# Patient Record
Sex: Female | Born: 1975 | Race: White | Hispanic: No | Marital: Married | Smoking: Never smoker
Health system: Southern US, Community
[De-identification: ages and names within clinical notes are randomized; demographics above are authoritative.]

## PROBLEM LIST (undated history)

## (undated) DIAGNOSIS — N201 Calculus of ureter: Secondary | ICD-10-CM

## (undated) HISTORY — PX: TONSILLECTOMY AND ADENOIDECTOMY: SUR1326

## (undated) HISTORY — PX: AUGMENTATION MAMMAPLASTY: SUR837

---

## 1999-12-13 ENCOUNTER — Other Ambulatory Visit: Admission: RE | Admit: 1999-12-13 | Discharge: 1999-12-13 | Payer: Self-pay | Admitting: Obstetrics and Gynecology

## 2001-01-23 ENCOUNTER — Other Ambulatory Visit: Admission: RE | Admit: 2001-01-23 | Discharge: 2001-01-23 | Payer: Self-pay | Admitting: Obstetrics and Gynecology

## 2002-04-09 ENCOUNTER — Encounter: Payer: Self-pay | Admitting: Emergency Medicine

## 2002-04-09 ENCOUNTER — Emergency Department (HOSPITAL_COMMUNITY): Admission: EM | Admit: 2002-04-09 | Discharge: 2002-04-09 | Payer: Self-pay | Admitting: Emergency Medicine

## 2002-05-07 ENCOUNTER — Other Ambulatory Visit: Admission: RE | Admit: 2002-05-07 | Discharge: 2002-05-07 | Payer: Self-pay | Admitting: Obstetrics and Gynecology

## 2003-03-12 ENCOUNTER — Other Ambulatory Visit: Admission: RE | Admit: 2003-03-12 | Discharge: 2003-03-12 | Payer: Self-pay | Admitting: Obstetrics and Gynecology

## 2003-04-09 ENCOUNTER — Observation Stay (HOSPITAL_COMMUNITY): Admission: AD | Admit: 2003-04-09 | Discharge: 2003-04-10 | Payer: Self-pay | Admitting: Obstetrics and Gynecology

## 2003-04-09 ENCOUNTER — Encounter: Admission: RE | Admit: 2003-04-09 | Discharge: 2003-04-09 | Payer: Self-pay | Admitting: Obstetrics and Gynecology

## 2003-09-25 ENCOUNTER — Inpatient Hospital Stay (HOSPITAL_COMMUNITY): Admission: AD | Admit: 2003-09-25 | Discharge: 2003-09-27 | Payer: Self-pay | Admitting: Obstetrics and Gynecology

## 2004-02-22 HISTORY — PX: LAPAROSCOPIC CHOLECYSTECTOMY: SUR755

## 2004-06-08 ENCOUNTER — Other Ambulatory Visit: Admission: RE | Admit: 2004-06-08 | Discharge: 2004-06-08 | Payer: Self-pay | Admitting: Obstetrics and Gynecology

## 2005-05-26 ENCOUNTER — Other Ambulatory Visit: Admission: RE | Admit: 2005-05-26 | Discharge: 2005-05-26 | Payer: Self-pay | Admitting: Obstetrics and Gynecology

## 2007-03-21 ENCOUNTER — Encounter (INDEPENDENT_AMBULATORY_CARE_PROVIDER_SITE_OTHER): Payer: Self-pay | Admitting: Obstetrics and Gynecology

## 2007-03-21 ENCOUNTER — Ambulatory Visit (HOSPITAL_COMMUNITY): Admission: RE | Admit: 2007-03-21 | Discharge: 2007-03-21 | Payer: Self-pay | Admitting: Obstetrics and Gynecology

## 2007-03-21 HISTORY — PX: OTHER SURGICAL HISTORY: SHX169

## 2008-12-24 ENCOUNTER — Inpatient Hospital Stay (HOSPITAL_COMMUNITY): Admission: RE | Admit: 2008-12-24 | Discharge: 2008-12-25 | Payer: Self-pay | Admitting: Obstetrics and Gynecology

## 2010-05-26 LAB — CBC
HCT: 26.4 % — ABNORMAL LOW (ref 36.0–46.0)
HCT: 30.6 % — ABNORMAL LOW (ref 36.0–46.0)
HCT: 33 % — ABNORMAL LOW (ref 36.0–46.0)
Hemoglobin: 10.4 g/dL — ABNORMAL LOW (ref 12.0–15.0)
Hemoglobin: 11.4 g/dL — ABNORMAL LOW (ref 12.0–15.0)
Hemoglobin: 9.2 g/dL — ABNORMAL LOW (ref 12.0–15.0)
MCHC: 33.9 g/dL (ref 30.0–36.0)
MCHC: 34.4 g/dL (ref 30.0–36.0)
MCHC: 34.7 g/dL (ref 30.0–36.0)
MCV: 96.6 fL (ref 78.0–100.0)
MCV: 96.7 fL (ref 78.0–100.0)
MCV: 97.7 fL (ref 78.0–100.0)
Platelets: 113 10*3/uL — ABNORMAL LOW (ref 150–400)
Platelets: 116 10*3/uL — ABNORMAL LOW (ref 150–400)
Platelets: 97 10*3/uL — ABNORMAL LOW (ref 150–400)
RBC: 2.74 MIL/uL — ABNORMAL LOW (ref 3.87–5.11)
RBC: 3.14 MIL/uL — ABNORMAL LOW (ref 3.87–5.11)
RBC: 3.42 MIL/uL — ABNORMAL LOW (ref 3.87–5.11)
RDW: 14.8 % (ref 11.5–15.5)
RDW: 14.9 % (ref 11.5–15.5)
RDW: 15.2 % (ref 11.5–15.5)
WBC: 11.3 10*3/uL — ABNORMAL HIGH (ref 4.0–10.5)
WBC: 8.4 10*3/uL (ref 4.0–10.5)
WBC: 9 10*3/uL (ref 4.0–10.5)

## 2010-05-26 LAB — RPR: RPR Ser Ql: NONREACTIVE

## 2010-07-06 NOTE — Op Note (Signed)
[name], [name]               ACCOUNT NO.:  [id number]   MEDICAL RECORD NO.:  [id number]          PATIENT TYPE:  AMB   LOCATION:  [facility]C                           FACILITY:  [facility]   PHYSICIAN:  Zenaida Niece, M.D.DATE OF BIRTH:  [birth year]   DATE OF PROCEDURE:  03/21/2007  DATE OF DISCHARGE:                               OPERATIVE REPORT   PREOPERATIVE AND POSTOPERATIVE DIAGNOSES:  Missed abortion.   PROCEDURE:  Dilation and evacuation.   SURGEON:  Zenaida Niece, M.D.   ANESTHESIA:  Monitored anesthesia care and paracervical block.   FINDINGS:  Uterus was slightly enlarged and sounded to 9 cm.  Abundant  products of conception were obtained.   SPECIMENS:  Products of conception sent to pathology.   ESTIMATED BLOOD LOSS:  50 mL.   COMPLICATIONS:  None.   PROCEDURE IN DETAIL:  The patient was taken to the operating room and  placed in the dorsal supine position.  She was given IV sedation and  placed in mobile stirrups.  Perineum and vagina were then prepped and  draped in the usual sterile fashion and bladder drained with a latex-  free catheter.   A Graves speculum was inserted into the vagina.  The anterior lip of the  cervix was infiltrated with 2 mL of 2% plain lidocaine, and then grasped  with an a single-tooth tenaculum.  Paracervical block was then performed  with a total of 16 mL of 2% plain lidocaine.  Uterus then sounded to 9  cm, and was gradually, easily dilated to a size 27 dilator.  A size 7  curved curette was then easily inserted and suction curettage was  performed with return of good products of conception.   Sharp curettage was then performed with good uterine cry in all  quadrants.  Suction curettage one more time revealed minimal blood.  The  single-tooth tenaculum was removed, and all bleeding was controlled with  pressure.  All instruments were then removed from the vagina.  The  patient tolerated the procedure well, and was taken to the  recovery room  in stable condition.  Counts were correct.      Zenaida Niece, M.D.  Electronically Signed     TDM/MEDQ  D:  03/21/2007  T:  03/21/2007  Job:  [id number]

## 2010-07-09 NOTE — Discharge Summary (Signed)
Jane [name], Jane [name]                           ACCOUNT NO.:  [id number]   MEDICAL RECORD NO.:  [id number]                   PATIENT TYPE:   LOCATION:                                       FACILITY:   PHYSICIAN:  Huel Cote, M.D.              DATE OF BIRTH:   DATE OF ADMISSION:  09/25/2003  DATE OF DISCHARGE:  09/27/2003                                 DISCHARGE SUMMARY   DISCHARGE DIAGNOSES:  1. Term pregnancy at 40 weeks delivered.  2. Status post normal spontaneous vaginal delivery.   DISCHARGE MEDICATIONS:  1. Motrin 600 mg p.o. q.6 h.  2. Percocet 1-2 tablets p.o. q.4 h. p.r.n.   FOLLOW UP:  The patient is to followup in six weeks for her routine  postpartum visit.   HOSPITAL COURSE:  The patient is a 35 year old, G1, P0 who was admitted at  [redacted] weeks gestation for induction of labor with a favorable cervix. Prenatal  care had been complicated by a right kidney stone at 14 to 15 weeks and  prenatal labs as follows:  O positive, antibody negative, RPR  nonreactive,  rubella immune, hepatitis B surface antigen negative, GC negative, chlamydia  negative, group B strep negative.   PAST MEDICAL HISTORY:  None.   PAST SURGICAL HISTORY:  Tonsillectomy.   PAST OBSTETRICAL HISTORY:  None.   ALLERGIES:  She is allergic to CODEINE.   On admission, she was afebrile with stable vital signs. Fetal heart rate was  reactive. Cervix was 1-2, 80% and -1 station.  She had rupture of membranes  performed with clear fluid noted.  She received an epidural at approximately  45 cm and continued to do well. She pushed for approximately two hours after  reaching complete dilation and brought the vertex to a plus 3 station.  She  then has normal spontaneous vaginal delivery of a viable female infant with  Apgar's of 8 & 9 and weight 8 pounds 8 ounces.  The baby delivered LOA  with  the left posterior arm across the chest and body with a body cord x1. She  had an irregular second degree  laceration which was repaired with 2-0 and 3-  0 Vicryl. She then did well and on postpartum day #2 had no complaints with  normal lochia. She was afebrile with stable vital signs. Her fundus was firm  and she was felt stable for discharge home.                                               Huel Cote, M.D.    KR/MEDQ  D:  10/19/2003  T:  10/20/2003  Job:  [id number]

## 2010-11-11 LAB — CBC
HCT: 37.5
MCV: 90.8
RBC: 4.13
WBC: 6

## 2011-02-03 ENCOUNTER — Encounter (HOSPITAL_COMMUNITY): Payer: Self-pay | Admitting: Anesthesiology

## 2011-02-03 ENCOUNTER — Ambulatory Visit (HOSPITAL_COMMUNITY)
Admission: AD | Admit: 2011-02-03 | Discharge: 2011-02-04 | Disposition: A | Discharge status: Home / Self Care | Payer: BC Managed Care – PPO | Source: Ambulatory Visit | Attending: Urology | Admitting: Urology

## 2011-02-03 ENCOUNTER — Encounter (HOSPITAL_COMMUNITY): Payer: Self-pay | Admitting: *Deleted

## 2011-02-03 ENCOUNTER — Ambulatory Visit (HOSPITAL_COMMUNITY): Payer: BC Managed Care – PPO | Admitting: Anesthesiology

## 2011-02-03 ENCOUNTER — Other Ambulatory Visit: Payer: Self-pay | Admitting: Urology

## 2011-02-03 ENCOUNTER — Encounter (HOSPITAL_COMMUNITY)
Admission: AD | Disposition: A | Discharge status: Home / Self Care | Payer: Self-pay | Source: Ambulatory Visit | Attending: Urology

## 2011-02-03 DIAGNOSIS — R509 Fever, unspecified: Secondary | ICD-10-CM | POA: Insufficient documentation

## 2011-02-03 DIAGNOSIS — N201 Calculus of ureter: Secondary | ICD-10-CM

## 2011-02-03 DIAGNOSIS — R109 Unspecified abdominal pain: Secondary | ICD-10-CM | POA: Insufficient documentation

## 2011-02-03 DIAGNOSIS — R000 Tachycardia, unspecified: Secondary | ICD-10-CM | POA: Insufficient documentation

## 2011-02-03 LAB — SURGICAL PCR SCREEN: MRSA, PCR: NEGATIVE

## 2011-02-03 SURGERY — CYSTOSCOPY, WITH STENT INSERTION
Anesthesia: General | Site: Ureter | Laterality: Right | Wound class: Clean Contaminated

## 2011-02-03 MED ORDER — PHENAZOPYRIDINE HCL 100 MG PO TABS
100.0000 mg | ORAL_TABLET | Freq: Three times a day (TID) | ORAL | Status: DC | PRN
  Filled 2011-02-03: qty 1

## 2011-02-03 MED ORDER — STERILE WATER FOR IRRIGATION IR SOLN
Status: DC | PRN
  Administered 2011-02-03: 3000 mL

## 2011-02-03 MED ORDER — PROMETHAZINE HCL 25 MG/ML IJ SOLN: 6.2500 mg | INTRAMUSCULAR | Status: DC | PRN

## 2011-02-03 MED ORDER — FENTANYL CITRATE 0.05 MG/ML IJ SOLN
INTRAMUSCULAR | Status: DC | PRN
  Administered 2011-02-03 (×2): 50 ug via INTRAVENOUS

## 2011-02-03 MED ORDER — BELLADONNA ALKALOIDS-OPIUM 16.2-60 MG RE SUPP
RECTAL | Status: DC | PRN
  Administered 2011-02-03: 1 via RECTAL

## 2011-02-03 MED ORDER — ACETAMINOPHEN 10 MG/ML IV SOLN
1000.0000 mg | Freq: Four times a day (QID) | INTRAVENOUS | Status: DC
  Administered 2011-02-03 – 2011-02-04 (×3): 1000 mg via INTRAVENOUS
  Filled 2011-02-03 (×4): qty 100

## 2011-02-03 MED ORDER — BELLADONNA ALKALOIDS-OPIUM 16.2-60 MG RE SUPP
RECTAL | Status: AC
  Filled 2011-02-03: qty 1

## 2011-02-03 MED ORDER — MORPHINE SULFATE 2 MG/ML IJ SOLN
2.0000 mg | INTRAMUSCULAR | Status: DC | PRN
  Administered 2011-02-03 – 2011-02-04 (×3): 2 mg via INTRAVENOUS
  Filled 2011-02-03 (×3): qty 1

## 2011-02-03 MED ORDER — LIDOCAINE HCL (CARDIAC) 20 MG/ML IV SOLN
INTRAVENOUS | Status: DC | PRN
  Administered 2011-02-03: 60 mg via INTRAVENOUS

## 2011-02-03 MED ORDER — ONDANSETRON HCL 4 MG/2ML IJ SOLN
INTRAMUSCULAR | Status: DC | PRN
  Administered 2011-02-03: 4 mg via INTRAVENOUS

## 2011-02-03 MED ORDER — CEFAZOLIN SODIUM 1-5 GM-% IV SOLN
1.0000 g | Freq: Once | INTRAVENOUS | Status: AC
  Administered 2011-02-03: 1 g via INTRAVENOUS
  Filled 2011-02-03: qty 50

## 2011-02-03 MED ORDER — TAMSULOSIN HCL 0.4 MG PO CAPS
0.4000 mg | ORAL_CAPSULE | ORAL | Status: DC
  Administered 2011-02-03: 0.4 mg via ORAL
  Filled 2011-02-03: qty 1

## 2011-02-03 MED ORDER — LIDOCAINE HCL 2 % EX GEL
CUTANEOUS | Status: AC
  Filled 2011-02-03: qty 10

## 2011-02-03 MED ORDER — BELLADONNA ALKALOIDS-OPIUM 16.2-60 MG RE SUPP
1.0000 | Freq: Four times a day (QID) | RECTAL | Status: DC | PRN
Start: 2011-02-03 — End: 2011-02-04

## 2011-02-03 MED ORDER — SUCCINYLCHOLINE CHLORIDE 20 MG/ML IJ SOLN
INTRAMUSCULAR | Status: DC | PRN
  Administered 2011-02-03: 100 mg via INTRAVENOUS

## 2011-02-03 MED ORDER — HYDROMORPHONE HCL PF 1 MG/ML IJ SOLN: 0.2500 mg | INTRAMUSCULAR | Status: DC | PRN

## 2011-02-03 MED ORDER — ACETAMINOPHEN 10 MG/ML IV SOLN
INTRAVENOUS | Status: DC | PRN
  Administered 2011-02-03: 1000 mg via INTRAVENOUS

## 2011-02-03 MED ORDER — HYOSCYAMINE SULFATE 0.125 MG SL SUBL
0.1250 mg | SUBLINGUAL_TABLET | SUBLINGUAL | Status: DC | PRN
  Filled 2011-02-03: qty 1

## 2011-02-03 MED ORDER — MIDAZOLAM HCL 5 MG/5ML IJ SOLN
INTRAMUSCULAR | Status: DC | PRN
  Administered 2011-02-03: 2 mg via INTRAVENOUS

## 2011-02-03 MED ORDER — SODIUM CHLORIDE 0.9 % IR SOLN
Status: DC | PRN
  Administered 2011-02-03: 1000 mL

## 2011-02-03 MED ORDER — OXYCODONE HCL 5 MG PO TABS
5.0000 mg | ORAL_TABLET | ORAL | Status: DC | PRN
  Administered 2011-02-03 – 2011-02-04 (×3): 5 mg via ORAL
  Filled 2011-02-03 (×3): qty 1

## 2011-02-03 MED ORDER — CEFAZOLIN SODIUM 1-5 GM-% IV SOLN
INTRAVENOUS | Status: AC
  Filled 2011-02-03: qty 50

## 2011-02-03 MED ORDER — PROPOFOL 10 MG/ML IV EMUL
INTRAVENOUS | Status: DC | PRN
  Administered 2011-02-03: 150 mg via INTRAVENOUS

## 2011-02-03 MED ORDER — ACETAMINOPHEN 325 MG PO TABS: 650.0000 mg | ORAL_TABLET | ORAL | Status: DC | PRN

## 2011-02-03 MED ORDER — SENNOSIDES-DOCUSATE SODIUM 8.6-50 MG PO TABS
1.0000 | ORAL_TABLET | Freq: Two times a day (BID) | ORAL | Status: DC
  Administered 2011-02-03 – 2011-02-04 (×2): 1 via ORAL
  Filled 2011-02-03 (×3): qty 1

## 2011-02-03 MED ORDER — CEFAZOLIN SODIUM 1-5 GM-% IV SOLN: 1.0000 g | INTRAVENOUS | Status: DC

## 2011-02-03 MED ORDER — IOHEXOL 300 MG/ML  SOLN
INTRAMUSCULAR | Status: AC
  Filled 2011-02-03: qty 1

## 2011-02-03 MED ORDER — LACTATED RINGERS IV SOLN
INTRAVENOUS | Status: DC
  Administered 2011-02-03: 1000 mL via INTRAVENOUS
  Administered 2011-02-03: 18:00:00 via INTRAVENOUS

## 2011-02-03 MED ORDER — DEXAMETHASONE SODIUM PHOSPHATE 10 MG/ML IJ SOLN
INTRAMUSCULAR | Status: DC | PRN
  Administered 2011-02-03: 10 mg via INTRAVENOUS

## 2011-02-03 MED ORDER — POLYETHYLENE GLYCOL 3350 17 G PO PACK
17.0000 g | PACK | Freq: Every day | ORAL | Status: DC
  Administered 2011-02-04: 17 g via ORAL
  Filled 2011-02-03 (×2): qty 1

## 2011-02-03 MED ORDER — CEFAZOLIN SODIUM 1-5 GM-% IV SOLN
1.0000 g | Freq: Three times a day (TID) | INTRAVENOUS | Status: DC
  Administered 2011-02-04: 1 g via INTRAVENOUS
  Filled 2011-02-03 (×2): qty 50

## 2011-02-03 MED ORDER — ACETAMINOPHEN 10 MG/ML IV SOLN
INTRAVENOUS | Status: AC
  Filled 2011-02-03: qty 100

## 2011-02-03 MED ORDER — ONDANSETRON HCL 4 MG/2ML IJ SOLN: 4.0000 mg | INTRAMUSCULAR | Status: DC | PRN

## 2011-02-03 MED ORDER — SODIUM CHLORIDE 0.9 % IV SOLN
INTRAVENOUS | Status: DC
  Administered 2011-02-03: 22:00:00 via INTRAVENOUS

## 2011-02-03 SURGICAL SUPPLY — 20 items
ADAPTER CATH URET PLST 4-6FR (CATHETERS) ×2 IMPLANT
ADPR CATH URET STRL DISP 4-6FR (CATHETERS) ×1
BAG URO CATCHER STRL LF (DRAPE) ×2 IMPLANT
BASKET ZERO TIP NITINOL 2.4FR (BASKET) IMPLANT
BSKT STON RTRVL ZERO TP 2.4FR (BASKET)
CATH INTERMIT  6FR 70CM (CATHETERS) ×1 IMPLANT
CLOTH BEACON ORANGE TIMEOUT ST (SAFETY) ×2 IMPLANT
DRAPE CAMERA CLOSED 9X96 (DRAPES) ×2 IMPLANT
GLOVE BIOGEL PI IND STRL 7.5 (GLOVE) ×1 IMPLANT
GLOVE BIOGEL PI INDICATOR 7.5 (GLOVE) ×1
GLOVE ECLIPSE 7.5 STRL STRAW (GLOVE) ×2 IMPLANT
GLOVE SURG SS PI 7.5 STRL IVOR (GLOVE) ×2 IMPLANT
GOWN PREVENTION PLUS XLARGE (GOWN DISPOSABLE) ×2 IMPLANT
GOWN STRL NON-REIN LRG LVL3 (GOWN DISPOSABLE) ×2 IMPLANT
GUIDEWIRE ANG ZIPWIRE 038X150 (WIRE) IMPLANT
GUIDEWIRE STR DUAL SENSOR (WIRE) ×2 IMPLANT
MANIFOLD NEPTUNE II (INSTRUMENTS) ×2 IMPLANT
PACK CYSTO (CUSTOM PROCEDURE TRAY) ×2 IMPLANT
STENT CONTOUR 6FRX24X.038 (STENTS) ×1 IMPLANT
TUBING CONNECTING 10 (TUBING) ×2 IMPLANT

## 2011-02-03 NOTE — Anesthesia Preprocedure Evaluation (Addendum)
Anesthesia Evaluation  Patient identified by MRN, date of birth, ID band Patient awake  General Assessment Comment:Ate 14:30  Reviewed: Allergy & Precautions, H&P , NPO status , Patient's Chart, lab work & pertinent test results, reviewed documented beta blocker date and time   Airway Mallampati: II  Neck ROM: Full    Dental  (+) Teeth Intact   Pulmonary neg pulmonary ROS,  clear to auscultation        Cardiovascular neg cardio ROS Regular Normal    Neuro/Psych Negative Neurological ROS  Negative Psych ROS   GI/Hepatic negative GI ROS, Neg liver ROS,   Endo/Other  Negative Endocrine ROS  Renal/GU Kidney stone  Genitourinary negative   Musculoskeletal negative musculoskeletal ROS (+)   Abdominal   Peds negative pediatric ROS (+)  Hematology negative hematology ROS (+)   Anesthesia Other Findings   Reproductive/Obstetrics negative OB ROS                          Anesthesia Physical Anesthesia Plan  ASA: I and Emergent  Anesthesia Plan: General   Post-op Pain Management:    Induction: Intravenous, Rapid sequence and Cricoid pressure planned  Airway Management Planned: Oral ETT  Additional Equipment:   Intra-op Plan:   Post-operative Plan: Extubation in OR  Informed Consent: I have reviewed the patients History and Physical, chart, labs and discussed the procedure including the risks, benefits and alternatives for the proposed anesthesia with the patient or authorized representative who has indicated his/her understanding and acceptance.     Plan Discussed with: CRNA and Surgeon  Anesthesia Plan Comments:         Anesthesia Quick Evaluation

## 2011-02-03 NOTE — Op Note (Signed)
NAME:  Jane [name], Jane [name] NO.:  [id number]  MEDICAL RECORD NO.:  [id number]  LOCATION:  [facility]                         FACILITY:  [facility]  PHYSICIAN:  Natalia Leatherwood, MD    DATE OF BIRTH:  October 07, 1975  DATE OF PROCEDURE:  02/03/2011 DATE OF DISCHARGE:                              OPERATIVE REPORT   SURGEON:  Natalia Leatherwood, MD.  ASSISTANT:  None.  PREOPERATIVE DIAGNOSIS:  Right ureter stone with fever.  POSTOPERATIVE DIAGNOSIS:  Right ureter stone with fever.  PROCEDURE PERFORMED:  Cystoscopy, right ureteral stent placement and fluoroscopy.  FINDINGS:  Cloudy efflux from the ureteral stent after placement.  COMPLICATIONS:  None.  ESTIMATED BLOOD LOSS:  None.  SPECIMEN:  None.  HISTORY OF PRESENT ILLNESS:  This is a 36 year old female who presented with right flank pain and was found to have a right ureteral stone.  She had elected to pass the stone on her own, however this afternoon, she called stating she had fever above 102.  She came directly to my office where we discussed management.  My concern is that if the stone is blocking her and she is manifesting fever, this could progress to sepsis.  Her urine culture is still pending.  We discussed treatment options and I recommended ureteral stent placement on the right.  She agreed.  PROCEDURE:  Informed consent was obtained.  The patient was taken to the operating room where she was placed in supine position, IV antibiotics were infused, and general anesthesia was induced.  She was then placed in a dorsal lithotomy position.  Genitals were prepped and draped in usual sterile fashion.  Time-out was performed in which the correct patient, surgical procedure, and location were identified and agreed upon.  Next, a 12-degree rigid cystoscope was advanced through the urethra and into the bladder.  The right ureteral orifice was identified immediately.  It was cannulated with a sensor tip wire, which was  placed up the orifice and into the ureter with ease with a good curl noted on the right renal pelvis on fluoroscopy.  Next, a right ureteral stent, 6 x 24 double-J stent was placed up over the wire without the strings on it.  This was deployed in the right renal pelvis on fluoroscopy with a good curl and deployed in the bladder with a good curl.  There was cloudy efflux from the stent after this was placed.  The wire was removed and the bladder was drained.  A B and O suppository was placed in her rectum and she was placed back in supine position.  Anesthesia was reversed.  She was taken to the PACU in stable condition.  She will be kept for 23-hour observation to ensure she does not have further fevers and to receive IV antibiotics.          ______________________________ Natalia Leatherwood, MD     DW/MEDQ  D:  02/03/2011  T:  02/03/2011  Job:  [id number]

## 2011-02-03 NOTE — Brief Op Note (Signed)
02/03/2011  6:27 PM  PATIENT:  Jane [name]  35 y.o. female  PRE-OPERATIVE DIAGNOSIS:  Right ureter stone and fever  POST-OPERATIVE DIAGNOSIS: Right ureter stone and fever  PROCEDURE:  Procedure(s): CYSTOSCOPY  Right ureter STENT PLACEMENT Fluoroscopy  SURGEON:  Surgeon(s): Milford Cage, MD  PHYSICIAN ASSISTANT: None  ASSISTANTS: none   ANESTHESIA:   general  EBL:     BLOOD ADMINISTERED:none  DRAINS: none   LOCAL MEDICATIONS USED:  NONE  SPECIMEN:  No Specimen  DISPOSITION OF SPECIMEN:  N/A  COUNTS:  YES  TOURNIQUET:  * No tourniquets in log *  DICTATION: .Other Dictation: Dictation Number [id number]  PLAN OF CARE: Admit for overnight observation  PATIENT DISPOSITION:  PACU - hemodynamically stable.   Delay start of Pharmacological VTE agent (>24hrs) due to surgical blood loss or risk of bleeding: No

## 2011-02-03 NOTE — H&P (Signed)
Chief Complaint  Right ureter stone   History of Present Illness  The patient presents having a temperature above 102. She continues to have pain in her right side. Her urine cultures yet to return. Because of the fever I have recommended placement of a ureteral stent. Other options were presented including IV antibiotic and observation. I have recommended a stent. We discussed the risks, benefits, alternatives, and likelihood of achieving goals. She wishes to proceed with stent placement.    Past Medical History Problems  1. History of  Allergic Rhinitis 477.9 2. History of  Migraine Headache 346.90 3. History of  Nephrolithiasis V13.01 4. History of  No Medical Problems  Surgical History Problems  1. History of  Cholecystectomy 2. History of  Cholecystectomy 3. History of  Tonsillectomy 4. History of  Tonsillectomy  Current Meds 1. Oxycodone-Acetaminophen 5-325 MG Oral Tablet; TAKE 1 TO 2 TABLETS EVERY 4 HOURS AS  NEEDED FOR PAIN; Therapy: 12Dec2012 to (Evaluate:15Dec2012); Last Rx:12Dec2012  Allergies Medication  1. No Known Drug Allergies  Family History Problems  1. Family history of  Family Health Status Number Of Children 1 son and 1 daughter 2. Maternal history of  Hematuria  Social History Problems  1. Marital History - Currently Married 2. 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 History of  Alcohol Use 5. History of  Caffeine Use  Review of Systems Skin, eye, otolaryngeal, hematologic/lymphatic, cardiovascular, pulmonary, endocrine, musculoskeletal, gastrointestinal and psychiatric system(s) were reviewed and pertinent findings if present are noted.  Constitutional: fever, but no night sweats.  Neurological: headache, but no dizziness.    Vitals Vital Signs [Data Includes: Last 1 Day]  13Dec2012 04:00PM  Temperature: 99 F  Physical Exam Constitutional: Well nourished and well developed.  ENT:. The ears and nose are normal in appearance.  The oropharynx is normal.  Neck: The appearance of the neck is normal and no neck mass is present.  Pulmonary: No respiratory distress and normal respiratory rhythm and effort.  Cardiovascular: Heart rate and rhythm are normal . No peripheral edema.  Abdomen: The abdomen is soft and nontender. The abdomen is no rebound. No CVA tenderness.  Lymphatics: The posterior cervical and supraclavicular nodes are not enlarged or tender.  Skin: Normal skin turgor and no visible rash.  Neuro/Psych:. Mood and affect are appropriate. No focal sensory deficits.    Assessment Assessed  1. Right Ureteral Stone  2. Fever  Plan  Patient elected for cystoscopy and right ureter stent placement today.

## 2011-02-03 NOTE — Anesthesia Postprocedure Evaluation (Signed)
  Anesthesia Post-op Note  Patient: Jane [name]  Procedure(s) Performed:  CYSTOSCOPY WITH STENT PLACEMENT - RIGHT URETERAL STENT PLACEMENT  Patient Location: PACU  Anesthesia Type: General  Level of Consciousness: awake, alert  and patient cooperative  Airway and Oxygen Therapy: Patient connected to face mask oxygen  Post-op Pain: none  Post-op Assessment: Post-op Vital signs reviewed, Patient's Cardiovascular Status Stable, No signs of Nausea or vomiting, Adequate PO intake and Pain level controlled  Post-op Vital Signs: stable  Complications: No apparent anesthesia complications

## 2011-02-03 NOTE — Transfer of Care (Signed)
Immediate Anesthesia Transfer of Care Note  Patient: Jane [name]  Procedure(s) Performed:  CYSTOSCOPY WITH STENT PLACEMENT - RIGHT URETERAL STENT PLACEMENT  Patient Location: PACU  Anesthesia Type: General  Level of Consciousness: sedated, patient cooperative and responds to stimulaton  Airway & Oxygen Therapy: Patient Spontanous Breathing and Patient connected to face mask oxgen  Post-op Assessment: Report given to PACU RN and Post -op Vital signs reviewed and stable  Post vital signs: Reviewed and stable  Complications: No apparent anesthesia complications

## 2011-02-04 ENCOUNTER — Other Ambulatory Visit: Payer: Self-pay | Admitting: Urology

## 2011-02-04 DIAGNOSIS — N201 Calculus of ureter: Secondary | ICD-10-CM

## 2011-02-04 MED ORDER — ONDANSETRON HCL 4 MG PO TABS: 4.0000 mg | ORAL_TABLET | Freq: Three times a day (TID) | ORAL | Status: AC | PRN

## 2011-02-04 MED ORDER — HYDROMORPHONE HCL 2 MG PO TABS: 2.0000 mg | ORAL_TABLET | ORAL | Status: AC | PRN

## 2011-02-04 MED ORDER — PHENAZOPYRIDINE HCL 100 MG PO TABS: 100.0000 mg | ORAL_TABLET | Freq: Three times a day (TID) | ORAL | Status: AC | PRN

## 2011-02-04 MED ORDER — SENNOSIDES-DOCUSATE SODIUM 8.6-50 MG PO TABS: 1.0000 | ORAL_TABLET | Freq: Two times a day (BID) | ORAL | Status: AC

## 2011-02-04 MED ORDER — POLYETHYLENE GLYCOL 3350 17 G PO PACK: 17.0000 g | PACK | Freq: Every day | ORAL | Status: AC

## 2011-02-04 MED ORDER — BELLADONNA ALKALOIDS-OPIUM 16.2-60 MG RE SUPP: 1.0000 | Freq: Four times a day (QID) | RECTAL | Status: AC | PRN

## 2011-02-04 MED ORDER — HYOSCYAMINE SULFATE 0.125 MG SL SUBL: 0.1250 mg | SUBLINGUAL_TABLET | SUBLINGUAL | Status: AC | PRN

## 2011-02-04 MED ORDER — HYDROMORPHONE HCL 2 MG PO TABS
2.0000 mg | ORAL_TABLET | ORAL | Status: DC | PRN
  Administered 2011-02-04: 2 mg via ORAL
  Filled 2011-02-04: qty 1

## 2011-02-04 NOTE — Plan of Care (Signed)
Problem: Phase I Progression Outcomes Goal: Initial discharge plan identified Outcome: Completed/Met Date Met:  02/04/11 Pt to be d/ced Home

## 2011-02-04 NOTE — Discharge Summary (Signed)
Physician Discharge Summary  Patient ID: Jane [name] MRN: [id number] DOB/AGE: [birth year] 35 y.o.  Admit date: 02/03/2011 Discharge date: 02/04/2011  Admission Diagnoses: Right ureter stone, fever  Discharge Diagnoses:  Right ureter stone, fever  Active Problems: Right ureter stone  Discharged Condition: good  Hospital Course:  Patient admitted for 23 hour observation following surgery.  Her tachycardia resolved and she did not have another fever.  Her urine culture for the outpatient office returned no growth.    Consults: none  Significant Diagnostic Studies: None  Treatments: surgery: Cystoscopy, right ureteroscopy.  Discharge Exam: Blood pressure 98/64, pulse 58, temperature 98.3 F (36.8 C), temperature source Oral, resp. rate 14, height 5\' 7"  (1.702 m), weight 56.7 kg (125 lb), last menstrual period 11/11/2010, SpO2 98.00%. See progress note from discharge date.  Disposition:   Discharge Orders    Future Orders Please Complete By Expires   Discharge patient        Current Discharge Medication List    START taking these medications   Details  HYDROmorphone (DILAUDID) 2 MG tablet Take 1-2 tablets (2-4 mg total) by mouth every 4 (four) hours as needed. Qty: 70 tablet, Refills: 0    hyoscyamine (LEVSIN SL) 0.125 MG SL tablet Place 1 tablet (0.125 mg total) under the tongue every 4 (four) hours as needed. Qty: 40 tablet, Refills: 3    ondansetron (ZOFRAN) 4 MG tablet Take 1 tablet (4 mg total) by mouth every 8 (eight) hours as needed for nausea. Qty: 20 tablet, Refills: 6    opium-belladonna (B&O SUPPRETTES) 16.2-60 MG suppository Place 1 suppository rectally every 6 (six) hours as needed (bladder spasms). Qty: 24 suppository, Refills: 0    phenazopyridine (PYRIDIUM) 100 MG tablet Take 1 tablet (100 mg total) by mouth every 8 (eight) hours as needed (Burning urination.). Qty: 30 tablet, Refills: 2    polyethylene glycol (MIRALAX / GLYCOLAX) packet Take 17 g  by mouth daily. Qty: 14 each, Refills: 3    senna-docusate (SENOKOT-S) 8.6-50 MG per tablet Take 1 tablet by mouth 2 (two) times daily. Qty: 60 tablet, Refills: 3      CONTINUE these medications which have NOT CHANGED   Details  Tamsulosin HCl (FLOMAX) 0.4 MG CAPS Take by mouth.        STOP taking these medications     ketorolac (TORADOL) 30 MG/ML injection      oxyCODONE-acetaminophen (PERCOCET) 5-325 MG per tablet        Follow-up Information    Follow up with Milford Cage, MD. (Office will contact you with appointment for surgery.)    Contact information:   [address] [facility] [place] [address] [phone]          Signed: Milford Cage 02/04/2011, 6:59 AM

## 2011-02-04 NOTE — Progress Notes (Signed)
Urology Progress Note  Subjective:     No acute urologic events overnight. No fever.  Tachycardia resolved.  Results from urine culture from outpatient office returned negative for growth.  Patient has stent discomfort.  Objective:  Patient Vitals for the past 24 hrs:  BP Temp Temp src Pulse Resp SpO2 Height Weight  02/04/11 0517 98/64 mmHg - - 58  - - - -  02/04/11 0438 88/61 mmHg 98.3 F (36.8 C) Oral 48  14  98 % - -  02/04/11 0156 96/61 mmHg 97.6 F (36.4 C) Oral 55  16  99 % - -  02/03/11 2023 - - - - - - 5\' 7"  (1.702 m) 56.7 kg (125 lb)  02/03/11 2009 102/68 mmHg 99.1 F (37.3 C) - 85  18  96 % - -  02/03/11 1945 113/63 mmHg - - 92  13  100 % - -  02/03/11 1930 110/63 mmHg - - 93  14  100 % - -  02/03/11 1915 106/74 mmHg - - 99  17  100 % - -  02/03/11 1900 112/66 mmHg 99 F (37.2 C) - 114  16  100 % - -  02/03/11 1845 112/69 mmHg - - 117  16  100 % - -  02/03/11 1836 120/75 mmHg 97 F (36.1 C) - 121  - 100 % - -  02/03/11 1715 - - - - - - 5\' 7"  (1.702 m) 56.7 kg (125 lb)  02/03/11 1545 126/84 mmHg 99.7 F (37.6 C) Oral 105  - 100 % - -    Physical Exam: General:  No acute distress, awake Cardiovascular:    [x]   S1/S2 present, RRR  []   Irregularly irregular Chest:  CTA-B Abdomen:               [x]  Soft, appropriately TTP  []  Soft, NTTP  []  Soft, appropriately TTP, incision(s) clean/dry/intact  Genitourinary: No foley in place.     I/O last 3 completed shifts: In: 400 [I.V.:400] Out: 100 [Urine:100]  No results found for this basename: HGB:2,WBC:2,PLT:2 in the last 72 hours  No results found for this basename: NA:2,K:2,CL:2,CO2:2,BUN:2,CREATININE:2,CALCIUM:2,MAGNESIUM:2,GFRNONAA:2,GFRAA:2 in the last 72 hours   No results found for this basename: PT:2,INR:2,APTT:2 in the last 72 hours   No components found with this basename: ABG:2     Assessment: POD#1 Cystoscopy and right ureteral stent placement Plan: -Switch to PO dilaudid. -Encourage use of B&O  suppositories for discomfort. -Discharge home with antibiotics. -Schedule for interval ureteroscopy.   Natalia Leatherwood, MD [phone]

## 2011-02-11 ENCOUNTER — Encounter (HOSPITAL_BASED_OUTPATIENT_CLINIC_OR_DEPARTMENT_OTHER): Payer: Self-pay | Admitting: *Deleted

## 2011-02-11 NOTE — Progress Notes (Signed)
NPO AFTER MN. PT ARRIVES AT 0715. NEEDS HG. SERUM PREG DONE 02-03-11, NEG.  MAY TAKE DILAUDID AND ZOFRAN AM OF SURG. W/ SIP OF WATER.

## 2011-02-15 NOTE — H&P (Signed)
Chief Complaint  Right ureter stone   History of Present Illness  Patient presented 02/02/11 to my office with a right ureter stone.  She had planned on management with observation.  The following day she called with continued right flank pain and fever of 102.  I advised right ureter stent placement due to possibility of obstructive infection.  That stent was placed the same day; 02/03/11.  She was observed and discharge home the following day.  She later continued to have fever and was diagnosed with the flu.  We discussed extensively her options for management of her stone.  These include removal of the stent in the office with attempt to pass stone on her own, shock wave lithotripsy, or ureteroscopy.  I explained that the stone could pass on its own with the stent in place as well.  She has elected for ureteroscopy.  For ureteroscopy I described the risks which include heart attack, stroke, pulmonary embolus, death, bleeding, infection, damage to contiguous structures, positioning injury, ureteral stricture, ureteral avulsion, ureteral injury, need for ureteral stent, inability to perform ureteroscopy, need for an interval procedure, inability to clear stone burden, stent discomfort, possibility that the stone may have already passed, and pain.  She presents today for cystoscopy, right ureteroscopy, right ureteral stent removal and possible replacement, laser lithotripsy.  No recent fevers, chills, nausea, or emesis.    Past Medical History Problems  1. History of  Allergic Rhinitis 477.9 2. History of  Migraine Headache 346.90 3. History of  Nephrolithiasis V13.01 4. History of  No Medical Problems  Surgical History Problems  1. History of  Cholecystectomy 2. History of  Cholecystectomy 3. History of  Tonsillectomy 4. History of  Tonsillectomy  Current Meds 1. Oxycodone-Acetaminophen 5-325 MG Oral Tablet; TAKE 1 TO 2 TABLETS EVERY 4 HOURS AS  NEEDED FOR PAIN; Therapy: 12Dec2012 to  (Evaluate:15Dec2012); Last Rx:12Dec2012  Allergies Medication  1. No Known Drug Allergies  Family History Problems  1. Family history of  Family Health Status Number Of Children 1 son and 1 daughter 2. Maternal history of  Hematuria  Social History Problems  1. Marital History - Currently Married 2. 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 History of  Alcohol Use 5. History of  Caffeine Use  Review of Systems Skin, eye, otolaryngeal, hematologic/lymphatic, cardiovascular, pulmonary, endocrine, musculoskeletal, gastrointestinal and psychiatric system(s) were reviewed and pertinent findings if present are noted.  Constitutional: fever, but no night sweats.  Neurological: headache, but no dizziness.     Physical Exam Constitutional: Well nourished and well developed.  ENT:. The ears and nose are normal in appearance. The oropharynx is normal.  Neck: The appearance of the neck is normal and no neck mass is present.  Pulmonary: No respiratory distress and normal respiratory rhythm and effort.  Cardiovascular: Heart rate and rhythm are normal . No peripheral edema.  Abdomen: The abdomen is soft and nontender. The abdomen is no rebound. No CVA tenderness.  Lymphatics: The posterior cervical and supraclavicular nodes are not enlarged or tender.  Skin: Normal skin turgor and no visible rash.  Neuro/Psych:. Mood and affect are appropriate. No focal sensory deficits.    Assessment Right Ureteral Stone  Plan  To OR for cystoscopy, right ureteroscopy, right ureteral stent removal and possible replacement, laser lithotripsy.

## 2011-02-16 ENCOUNTER — Encounter (HOSPITAL_COMMUNITY): Payer: Self-pay | Admitting: *Deleted

## 2011-02-16 ENCOUNTER — Encounter (HOSPITAL_BASED_OUTPATIENT_CLINIC_OR_DEPARTMENT_OTHER): Payer: Self-pay | Admitting: *Deleted

## 2011-02-16 ENCOUNTER — Encounter (HOSPITAL_COMMUNITY)
Admission: RE | Disposition: A | Discharge status: Home / Self Care | Payer: Self-pay | Source: Ambulatory Visit | Attending: Urology

## 2011-02-16 ENCOUNTER — Ambulatory Visit (HOSPITAL_BASED_OUTPATIENT_CLINIC_OR_DEPARTMENT_OTHER): Payer: BC Managed Care – PPO | Admitting: Anesthesiology

## 2011-02-16 ENCOUNTER — Observation Stay (HOSPITAL_BASED_OUTPATIENT_CLINIC_OR_DEPARTMENT_OTHER)
Admission: RE | Admit: 2011-02-16 | Discharge: 2011-02-17 | Disposition: A | Discharge status: Home / Self Care | Payer: BC Managed Care – PPO | Source: Ambulatory Visit | Attending: Urology | Admitting: Urology

## 2011-02-16 ENCOUNTER — Observation Stay (HOSPITAL_COMMUNITY): Admission: AD | Admit: 2011-02-16 | Payer: BC Managed Care – PPO | Source: Ambulatory Visit | Admitting: Urology

## 2011-02-16 ENCOUNTER — Encounter (HOSPITAL_BASED_OUTPATIENT_CLINIC_OR_DEPARTMENT_OTHER): Payer: Self-pay | Admitting: Anesthesiology

## 2011-02-16 DIAGNOSIS — N201 Calculus of ureter: Principal | ICD-10-CM

## 2011-02-16 HISTORY — PX: CYSTOSCOPY W/ URETERAL STENT REMOVAL: SHX1430

## 2011-02-16 HISTORY — DX: Calculus of ureter: N20.1

## 2011-02-16 HISTORY — PX: CYSTOSCOPY WITH URETEROSCOPY: SHX5123

## 2011-02-16 HISTORY — PX: STONE EXTRACTION WITH BASKET: SHX5318

## 2011-02-16 LAB — POCT HEMOGLOBIN-HEMACUE: Hemoglobin: 12.3 g/dL (ref 12.0–15.0)

## 2011-02-16 SURGERY — HOLMIUM LASER APPLICATION
Anesthesia: General | Site: Ureter | Laterality: Right | Wound class: Clean Contaminated

## 2011-02-16 MED ORDER — OXYCODONE-ACETAMINOPHEN 5-325 MG PO TABS
1.0000 | ORAL_TABLET | ORAL | Status: AC | PRN
  Administered 2011-02-16: 1 via ORAL

## 2011-02-16 MED ORDER — DEXAMETHASONE SODIUM PHOSPHATE 4 MG/ML IJ SOLN
INTRAMUSCULAR | Status: DC | PRN
  Administered 2011-02-16: 8 mg via INTRAVENOUS

## 2011-02-16 MED ORDER — LIDOCAINE HCL (CARDIAC) 20 MG/ML IV SOLN
INTRAVENOUS | Status: DC | PRN
  Administered 2011-02-16: 60 mg via INTRAVENOUS

## 2011-02-16 MED ORDER — BELLADONNA ALKALOIDS-OPIUM 16.2-60 MG RE SUPP
RECTAL | Status: DC | PRN
  Administered 2011-02-16: 1 via RECTAL

## 2011-02-16 MED ORDER — PHENAZOPYRIDINE HCL 200 MG PO TABS
200.0000 mg | ORAL_TABLET | Freq: Once | ORAL | Status: AC
  Administered 2011-02-16: 200 mg via ORAL

## 2011-02-16 MED ORDER — MIDAZOLAM HCL 5 MG/5ML IJ SOLN
INTRAMUSCULAR | Status: DC | PRN
  Administered 2011-02-16: 2 mg via INTRAVENOUS

## 2011-02-16 MED ORDER — ACETAMINOPHEN 325 MG PO TABS: 650.0000 mg | ORAL_TABLET | ORAL | Status: DC | PRN

## 2011-02-16 MED ORDER — CEFAZOLIN SODIUM 1-5 GM-% IV SOLN: 1.0000 g | Freq: Three times a day (TID) | INTRAVENOUS | Status: DC

## 2011-02-16 MED ORDER — EPHEDRINE SULFATE 50 MG/ML IJ SOLN
INTRAMUSCULAR | Status: DC | PRN
  Administered 2011-02-16: 10 mg via INTRAVENOUS

## 2011-02-16 MED ORDER — PROPOFOL 10 MG/ML IV EMUL
INTRAVENOUS | Status: DC | PRN
  Administered 2011-02-16: 170 mg via INTRAVENOUS

## 2011-02-16 MED ORDER — CEFAZOLIN SODIUM 1-5 GM-% IV SOLN
1.0000 g | INTRAVENOUS | Status: AC
  Administered 2011-02-16: 1 g via INTRAVENOUS

## 2011-02-16 MED ORDER — OXYCODONE HCL 5 MG PO TABS
5.0000 mg | ORAL_TABLET | ORAL | Status: DC | PRN
  Administered 2011-02-16: 10 mg via ORAL
  Filled 2011-02-16: qty 2

## 2011-02-16 MED ORDER — OXYBUTYNIN CHLORIDE 5 MG PO TABS
5.0000 mg | ORAL_TABLET | Freq: Four times a day (QID) | ORAL | Status: DC | PRN
  Administered 2011-02-16: 5 mg via ORAL
  Filled 2011-02-16 (×2): qty 1

## 2011-02-16 MED ORDER — HYDROMORPHONE HCL PF 1 MG/ML IJ SOLN
0.2500 mg | INTRAMUSCULAR | Status: DC | PRN
  Administered 2011-02-16 (×4): 0.5 mg via INTRAVENOUS

## 2011-02-16 MED ORDER — OXYCODONE HCL 5 MG PO TABS: 5.0000 mg | ORAL_TABLET | ORAL | Status: DC | PRN

## 2011-02-16 MED ORDER — SODIUM CHLORIDE 0.9 % IR SOLN
Status: DC | PRN
  Administered 2011-02-16: 3000 mL

## 2011-02-16 MED ORDER — CEFAZOLIN SODIUM 1-5 GM-% IV SOLN: 1.0000 g | INTRAVENOUS | Status: DC

## 2011-02-16 MED ORDER — ACETAMINOPHEN 10 MG/ML IV SOLN: 1000.0000 mg | Freq: Four times a day (QID) | INTRAVENOUS | Status: DC

## 2011-02-16 MED ORDER — IOHEXOL 350 MG/ML SOLN
INTRAVENOUS | Status: DC | PRN
  Administered 2011-02-16: 50 mL

## 2011-02-16 MED ORDER — PHENAZOPYRIDINE HCL 200 MG PO TABS
200.0000 mg | ORAL_TABLET | Freq: Three times a day (TID) | ORAL | Status: AC
  Administered 2011-02-16: 200 mg via ORAL

## 2011-02-16 MED ORDER — ACETAMINOPHEN 10 MG/ML IV SOLN
1000.0000 mg | Freq: Four times a day (QID) | INTRAVENOUS | Status: DC
  Administered 2011-02-16 – 2011-02-17 (×3): 1000 mg via INTRAVENOUS
  Filled 2011-02-16 (×4): qty 100

## 2011-02-16 MED ORDER — DROPERIDOL 2.5 MG/ML IJ SOLN
0.6250 mg | INTRAMUSCULAR | Status: DC | PRN
  Administered 2011-02-16: 0.625 mg via INTRAVENOUS

## 2011-02-16 MED ORDER — POLYETHYLENE GLYCOL 3350 17 G PO PACK
17.0000 g | PACK | Freq: Every day | ORAL | Status: DC
  Administered 2011-02-16: 17 g via ORAL
  Filled 2011-02-16 (×2): qty 1

## 2011-02-16 MED ORDER — LACTATED RINGERS IV SOLN
INTRAVENOUS | Status: DC
  Administered 2011-02-16: 10:00:00 via INTRAVENOUS

## 2011-02-16 MED ORDER — OXYBUTYNIN CHLORIDE 5 MG PO TABS
5.0000 mg | ORAL_TABLET | Freq: Once | ORAL | Status: AC
  Administered 2011-02-16: 5 mg via ORAL

## 2011-02-16 MED ORDER — CEFAZOLIN SODIUM 1-5 GM-% IV SOLN
1.0000 g | Freq: Three times a day (TID) | INTRAVENOUS | Status: AC
  Administered 2011-02-16 (×2): 1 g via INTRAVENOUS
  Filled 2011-02-16 (×2): qty 50

## 2011-02-16 MED ORDER — PROMETHAZINE HCL 25 MG/ML IJ SOLN: 6.2500 mg | INTRAMUSCULAR | Status: DC | PRN

## 2011-02-16 MED ORDER — POLYETHYLENE GLYCOL 3350 17 G PO PACK: 17.0000 g | PACK | Freq: Every day | ORAL | Status: DC

## 2011-02-16 MED ORDER — SODIUM CHLORIDE 0.9 % IV SOLN: INTRAVENOUS | Status: DC

## 2011-02-16 MED ORDER — SENNOSIDES-DOCUSATE SODIUM 8.6-50 MG PO TABS: 1.0000 | ORAL_TABLET | Freq: Two times a day (BID) | ORAL | Status: DC

## 2011-02-16 MED ORDER — HYDROMORPHONE HCL PF 1 MG/ML IJ SOLN
0.5000 mg | INTRAMUSCULAR | Status: DC | PRN
  Administered 2011-02-16 – 2011-02-17 (×5): 1 mg via INTRAVENOUS
  Filled 2011-02-16 (×4): qty 1

## 2011-02-16 MED ORDER — SENNOSIDES-DOCUSATE SODIUM 8.6-50 MG PO TABS
1.0000 | ORAL_TABLET | Freq: Two times a day (BID) | ORAL | Status: DC
  Administered 2011-02-16 (×2): 1 via ORAL
  Filled 2011-02-16 (×4): qty 1

## 2011-02-16 MED ORDER — ONDANSETRON HCL 4 MG/2ML IJ SOLN: 4.0000 mg | INTRAMUSCULAR | Status: DC | PRN

## 2011-02-16 MED ORDER — HYDROMORPHONE HCL PF 1 MG/ML IJ SOLN
INTRAMUSCULAR | Status: AC
  Filled 2011-02-16: qty 1

## 2011-02-16 MED ORDER — HYDROMORPHONE HCL PF 1 MG/ML IJ SOLN: 0.5000 mg | INTRAMUSCULAR | Status: DC | PRN

## 2011-02-16 MED ORDER — HYOSCYAMINE SULFATE 0.125 MG SL SUBL
0.1250 mg | SUBLINGUAL_TABLET | SUBLINGUAL | Status: DC | PRN
  Filled 2011-02-16: qty 1

## 2011-02-16 MED ORDER — ENOXAPARIN SODIUM 40 MG/0.4ML ~~LOC~~ SOLN: 40.0000 mg | SUBCUTANEOUS | Status: DC

## 2011-02-16 MED ORDER — LACTATED RINGERS IV SOLN
INTRAVENOUS | Status: DC
  Administered 2011-02-16: 100 mL/h via INTRAVENOUS

## 2011-02-16 MED ORDER — PROMETHAZINE HCL 25 MG/ML IJ SOLN
12.5000 mg | Freq: Once | INTRAMUSCULAR | Status: DC
  Filled 2011-02-16: qty 1

## 2011-02-16 MED ORDER — FENTANYL CITRATE 0.05 MG/ML IJ SOLN
INTRAMUSCULAR | Status: DC | PRN
  Administered 2011-02-16 (×4): 50 ug via INTRAVENOUS

## 2011-02-16 MED ORDER — SODIUM CHLORIDE 0.9 % IV SOLN
Freq: Once | INTRAVENOUS | Status: AC
  Administered 2011-02-16: 20:00:00 via INTRAVENOUS

## 2011-02-16 MED ORDER — ONDANSETRON HCL 4 MG/2ML IJ SOLN
4.0000 mg | INTRAMUSCULAR | Status: DC | PRN
  Administered 2011-02-16 – 2011-02-17 (×2): 4 mg via INTRAVENOUS
  Filled 2011-02-16 (×2): qty 2

## 2011-02-16 MED ORDER — FENTANYL CITRATE 0.05 MG/ML IJ SOLN
25.0000 ug | INTRAMUSCULAR | Status: DC | PRN
  Administered 2011-02-16 (×4): 25 ug via INTRAVENOUS

## 2011-02-16 MED ORDER — BELLADONNA ALKALOIDS-OPIUM 16.2-60 MG RE SUPP: 1.0000 | Freq: Four times a day (QID) | RECTAL | Status: DC | PRN

## 2011-02-16 MED ORDER — ENOXAPARIN SODIUM 40 MG/0.4ML ~~LOC~~ SOLN
40.0000 mg | SUBCUTANEOUS | Status: DC
  Filled 2011-02-16: qty 0.4

## 2011-02-16 MED ORDER — SODIUM CHLORIDE 0.9 % IV SOLN
INTRAVENOUS | Status: DC
  Administered 2011-02-16: 16:00:00 via INTRAVENOUS

## 2011-02-16 MED ORDER — ENOXAPARIN SODIUM 40 MG/0.4ML ~~LOC~~ SOLN
40.0000 mg | SUBCUTANEOUS | Status: DC
  Administered 2011-02-16: 40 mg via SUBCUTANEOUS
  Filled 2011-02-16 (×2): qty 0.4

## 2011-02-16 SURGICAL SUPPLY — 44 items
ADAPTER CATH URET PLST 4-6FR (CATHETERS) IMPLANT
ADPR CATH URET STRL DISP 4-6FR (CATHETERS)
BAG DRAIN URO-CYSTO SKYTR STRL (DRAIN) ×3 IMPLANT
BAG DRN UROCATH (DRAIN) ×2
BASKET LASER NITINOL 1.9FR (BASKET) IMPLANT
BASKET STNLS GEMINI 4WIRE 3FR (BASKET) IMPLANT
BASKET ZERO TIP NITINOL 2.4FR (BASKET) ×2 IMPLANT
BRUSH URET BIOPSY 3F (UROLOGICAL SUPPLIES) IMPLANT
BSKT STON RTRVL 120 1.9FR (BASKET)
BSKT STON RTRVL GEM 120X11 3FR (BASKET)
BSKT STON RTRVL ZERO TP 2.4FR (BASKET) ×2
CANISTER SUCT LVC 12 LTR MEDI- (MISCELLANEOUS) ×2 IMPLANT
CATH CLEAR GEL 3F BACKSTOP (CATHETERS) ×2 IMPLANT
CATH INTERMIT  6FR 70CM (CATHETERS) IMPLANT
CATH ROBINSON RED A/P 16FR (CATHETERS) ×2 IMPLANT
CATH URET 5FR 28IN CONE TIP (BALLOONS)
CATH URET 5FR 28IN OPEN ENDED (CATHETERS) IMPLANT
CATH URET 5FR 70CM CONE TIP (BALLOONS) IMPLANT
CATH URET DUAL LUMEN 6-10FR 50 (CATHETERS) ×2 IMPLANT
CLOTH BEACON ORANGE TIMEOUT ST (SAFETY) ×3 IMPLANT
DRAPE CAMERA CLOSED 9X96 (DRAPES) ×3 IMPLANT
ELECT REM PT RETURN 9FT ADLT (ELECTROSURGICAL)
ELECTRODE REM PT RTRN 9FT ADLT (ELECTROSURGICAL) IMPLANT
GLOVE BIOGEL M 6.5 STRL (GLOVE) ×2 IMPLANT
GLOVE ECLIPSE 6.0 STRL STRAW (GLOVE) ×2 IMPLANT
GLOVE ECLIPSE 7.0 STRL STRAW (GLOVE) ×3 IMPLANT
GLOVE INDICATOR 7.5 STRL GRN (GLOVE) ×3 IMPLANT
GOWN PREVENTION PLUS LG XLONG (DISPOSABLE) ×3 IMPLANT
GOWN STRL REIN XL XLG (GOWN DISPOSABLE) ×3 IMPLANT
GUIDEWIRE 0.038 PTFE COATED (WIRE) IMPLANT
GUIDEWIRE ANG ZIPWIRE 038X150 (WIRE) IMPLANT
GUIDEWIRE STR DUAL SENSOR (WIRE) ×5 IMPLANT
GUIDEWIRE SUPER STIFF (WIRE) ×4 IMPLANT
IV NS IRRIG 3000ML ARTHROMATIC (IV SOLUTION) ×5 IMPLANT
KIT BALLIN UROMAX 15FX10 (LABEL) IMPLANT
KIT BALLN UROMAX 15FX4 (MISCELLANEOUS) IMPLANT
KIT BALLN UROMAX 26 75X4 (MISCELLANEOUS)
LASER FIBER DISP (UROLOGICAL SUPPLIES) ×2 IMPLANT
NS IRRIG 500ML POUR BTL (IV SOLUTION) IMPLANT
PACK CYSTOSCOPY (CUSTOM PROCEDURE TRAY) ×3 IMPLANT
SET HIGH PRES BAL DIL (LABEL)
SHEATH URET ACCESS 12FR/35CM (UROLOGICAL SUPPLIES) IMPLANT
SHEATH URET ACCESS 12FR/55CM (UROLOGICAL SUPPLIES) IMPLANT
SYRINGE IRR TOOMEY STRL 70CC (SYRINGE) IMPLANT

## 2011-02-16 NOTE — Anesthesia Preprocedure Evaluation (Signed)
Anesthesia Evaluation  Patient identified by MRN, date of birth, ID band Patient awake    Reviewed: Allergy & Precautions, H&P , NPO status , Patient's Chart, lab work & pertinent test results  History of Anesthesia Complications Negative for: history of anesthetic complications  Airway Mallampati: I TM Distance: >3 FB Neck ROM: full    Dental No notable dental hx. (+) Teeth Intact and Dental Advidsory Given   Pulmonary neg pulmonary ROS,  clear to auscultation  Pulmonary exam normal       Cardiovascular Exercise Tolerance: Good neg cardio ROS regular Normal    Neuro/Psych Negative Neurological ROS  Negative Psych ROS   GI/Hepatic negative GI ROS, Neg liver ROS,   Endo/Other  Negative Endocrine ROS  Renal/GU negative Renal ROS  Genitourinary negative   Musculoskeletal negative musculoskeletal ROS (+)   Abdominal Normal abdominal exam  (+)   Peds negative pediatric ROS (+)  Hematology negative hematology ROS (+)   Anesthesia Other Findings   Reproductive/Obstetrics negative OB ROS                           Anesthesia Physical Anesthesia Plan  ASA: I  Anesthesia Plan: General LMA   Post-op Pain Management:    Induction:   Airway Management Planned:   Additional Equipment:   Intra-op Plan:   Post-operative Plan:   Informed Consent: I have reviewed the patients History and Physical, chart, labs and discussed the procedure including the risks, benefits and alternatives for the proposed anesthesia with the patient or authorized representative who has indicated his/her understanding and acceptance.   Dental Advisory Given  Plan Discussed with: CRNA  Anesthesia Plan Comments:         Anesthesia Quick Evaluation

## 2011-02-16 NOTE — Progress Notes (Signed)
Dr Rica Mast notified of pt's cont pain without relief of fentanyl , c/o nausea w B/P 80's-90's systolic. New order noted

## 2011-02-16 NOTE — Progress Notes (Signed)
GU  Patient having right flank pain that is difficult to control with PO medications.  Filed Vitals:   02/16/11 1230  BP: 91/55  Pulse: 57  Temp:   Resp: 28    PE: Gen: NAD, Awake Abd: soft, NTTP CV: S1/S2  A/P: Right renal colic following urerteroscopy -Will admit patient for 23 hour observation and IV pain medications.

## 2011-02-16 NOTE — Progress Notes (Signed)
Dr Margarita Grizzle notified of pt's cont pain described as a severe spasm right flank without relief of pain meds given. New order noted for pyridium to be given

## 2011-02-16 NOTE — Anesthesia Procedure Notes (Signed)
Procedure Name: LMA Insertion Date/Time: 02/16/2011 8:21 AM Performed by: Renella Cunas D Pre-anesthesia Checklist: Patient identified, Emergency Drugs available, Suction available and Patient being monitored Patient Re-evaluated:Patient Re-evaluated prior to inductionOxygen Delivery Method: Circle System Utilized Preoxygenation: Pre-oxygenation with 100% oxygen Intubation Type: IV induction Ventilation: Mask ventilation without difficulty LMA: LMA inserted LMA Size: 4.0 Number of attempts: 1 Placement Confirmation: positive ETCO2 Tube secured with: Tape Dental Injury: Teeth and Oropharynx as per pre-operative assessment

## 2011-02-16 NOTE — Op Note (Signed)
Jane [name], Jane [name] NO.:  [id number]  MEDICAL RECORD NO.:  [id number]  LOCATION:  1430                         FACILITY:  [facility]  PHYSICIAN:  Natalia Leatherwood, MD    DATE OF BIRTH:  12/23/1975  DATE OF PROCEDURE:  02/16/2011 DATE OF DISCHARGE:                              OPERATIVE REPORT   SURGEON:  Natalia Leatherwood, MD  ASSISTANT:  None.  PREOPERATIVE DIAGNOSES:  Right ureteral stone, right nephrolithiasis.  POSTOPERATIVE DIAGNOSES:  Right ureteral stone, right nephrolithiasis.  PROCEDURES PERFORMED: 1. Cystoscopy. 2. Fluoroscopy. 3. Right ureteroscopy. 4. Laser lithotripsy. 5. Basket stone retrieval. 6. Right ureteral stent removal.  SPECIMENS:  Two stones, 1 from the ureter and 1 from the kidney removed and sent for stone analysis to the [facility].  ESTIMATED BLOOD LOSS:  None.  FINDINGS:  Redundant proximal ureter.  No ureteral stent left in place.  DRAINS:  None.  COMPLICATIONS:  None.  HISTORY OF PRESENT ILLNESS:  This is a 35 year old female who has a history of right nephrolithiasis and right ureteral stone.  She had a ureteral stent placed as it was felt that she was having obstructive sepsis due to high fever; however, she had a stent placed and then developed the flu.  She presents today for definitive management of the stones.  DESCRIPTION OF PROCEDURE:  Informed consent was obtained.  The patient was taken to the operating room where she was placed in supine position. IV antibiotics were infused and general anesthesia was induced.  She was then placed in dorsal lithotomy position making sure to pad all pertinent neurovascular pressure points.  SCDs were in place and turned on.  Next, a cystoscope was advanced through the urethra into the bladder.  The right ureteral orifice was identified and a sensor tip wire was placed up the orifice into the right renal pelvis with ease. Next, a stent grasper was used to grasp  the stent was brought out to the urethral meatus.  A second sensor tip wire was placed up the stent with ease and with a good curl on fluoroscopy at the renal pelvis.  One wire was then secured to the drape as a safety wire.  The other was used as a working wire.  Attempt at placement of the digital flexible ureteroscope over the sensor tip wire was successful to place the camera into the right ureteral orifice.  However, it could not be advanced beyond this. The working sensor tip wire was removed and Amplatz Super Stiff wire was placed through the ureteroscope and into the more proximal ureter.  The scope was then advanced easily over this wire and the wire was removed.  The ureteral stone was encountered and Backstop Gel was placed behind the stone.  A 200 micron holmium laser wire was then placed up the ureteroscope and lithotripsy was carried out at 0.5 joules and 20 Hz.  It only took a few shocks to knock off one jagged edge of the stone, which allowed me to then remove the rest of the stone.  The stone was grasped with zero-tip nitinol basket and removed from the entire urinary system and was set on the back table.  Next, the dual-lumen access sheath was placed over the safety wire, and then 2 Amplatz Super Stiff wires were placed up this dual lumen access sheath into the renal pelvis.  The dual-lumen was removed and then the ureteroscope was placed up over on wire.  The kidney was evaluated in a systematic fashion and there were no stones found in the kidney. There had appeared to be a stone in the kidney on plain film x-rays. While removing the ureteroscope, the stone was found in the proximal ureter, which was felt to likely be the stone that was in the kidney that had passed into the ureter.  This was grasped with zero-tip nitinol basket and removed.  The entirety of the ureter was visualized and there were no lesions or injuries.  The safety wire was removed.  It is felt that  ureteral stent did not need to be left in place.  The bladder was drained and a B and O suppository was placed into the rectum.  This completed the procedure.  She will follow up with me in 3 months with a 24 urinalysis prior.          ______________________________ Natalia Leatherwood, MD     DW/MEDQ  D:  02/16/2011  T:  02/16/2011  Job:  [id number]

## 2011-02-16 NOTE — Addendum Note (Signed)
Addendum  created 02/16/11 1159 by Einar Pheasant, MD   Modules edited:Orders, PRL Based Order Sets

## 2011-02-16 NOTE — Anesthesia Postprocedure Evaluation (Signed)
Anesthesia Post Note  Patient: Jane [name]  Procedure(s) Performed:  HOLMIUM LASER APPLICATION; CYSTOSCOPY WITH STENT REMOVAL; CYSTOSCOPY WITH URETEROSCOPY; STONE EXTRACTION WITH BASKET  Anesthesia type: General  Patient location: PACU  Post pain: Pain level controlled  Post assessment: Post-op Vital signs reviewed  Last Vitals:  Filed Vitals:   02/16/11 1045  BP: 103/73  Pulse: 61  Temp:   Resp: 15    Post vital signs: Reviewed  Level of consciousness: sedated  Complications: No apparent anesthesia complications

## 2011-02-16 NOTE — Progress Notes (Signed)
Transferred to room 1430 via stretcher accompanied by nurses

## 2011-02-16 NOTE — Transfer of Care (Signed)
Immediate Anesthesia Transfer of Care Note  Patient: Jane [name]  Procedure(s) Performed:  HOLMIUM LASER APPLICATION; CYSTOSCOPY WITH STENT REMOVAL; CYSTOSCOPY WITH URETEROSCOPY; STONE EXTRACTION WITH BASKET  Patient Location: PACU  Anesthesia Type: General  Level of Consciousness: awake, oriented, sedated and patient cooperative  Airway & Oxygen Therapy: Patient Spontanous Breathing and Patient connected to face mask oxygen  Post-op Assessment: Report given to PACU RN and Post -op Vital signs reviewed and stable  Post vital signs: Reviewed and stable  Complications: No apparent anesthesia complications

## 2011-02-16 NOTE — Progress Notes (Signed)
Report given to Joaquin Music RN for pt to be admitted to room 1430.

## 2011-02-16 NOTE — Brief Op Note (Signed)
02/16/2011  9:32 AM  PATIENT:  Jane [name]  35 y.o. female  PRE-OPERATIVE DIAGNOSIS:  right ureteral stone, right nephrolithiasis  POST-OPERATIVE DIAGNOSIS:  right ureteral stone, right nephrolithiasis  PROCEDURE:  Procedure(s): HOLMIUM LASER LITHOTRIPSY CYSTOSCOPY WITH RIGHT URETER STENT REMOVAL CYSTOSCOPY WITH URETEROSCOPY STONE EXTRACTION WITH BASKET FLUOROSCOPY  SURGEON:  Surgeon(s): Milford Cage, MD  PHYSICIAN ASSISTANT: None  ASSISTANTS: none   ANESTHESIA:   general  EBL:  Total I/O In: 100 [I.V.:100] Out: -   BLOOD ADMINISTERED:none  DRAINS: none   LOCAL MEDICATIONS USED:  NONE  SPECIMEN:  Source of Specimen:  RIght ureter  DISPOSITION OF SPECIMEN:  AUS lab for stone analysis.  COUNTS:  YES  TOURNIQUET:  * No tourniquets in log *  DICTATION: .Other Dictation: Dictation Number [id number]  PLAN OF CARE: Discharge to home after PACU  PATIENT DISPOSITION:  PACU - hemodynamically stable.   Delay start of Pharmacological VTE agent (>24hrs) due to surgical blood loss or risk of bleeding:  No

## 2011-02-16 NOTE — Progress Notes (Signed)
Called Bed Placement to request for an Observation Bed per Dr Hilario Quarry order.

## 2011-02-17 ENCOUNTER — Encounter (HOSPITAL_BASED_OUTPATIENT_CLINIC_OR_DEPARTMENT_OTHER): Payer: Self-pay | Admitting: Urology

## 2011-02-17 MED ORDER — OXYBUTYNIN CHLORIDE 5 MG PO TABS: 5.0000 mg | ORAL_TABLET | Freq: Four times a day (QID) | ORAL | Status: AC | PRN

## 2011-02-17 NOTE — Discharge Summary (Signed)
Physician Discharge Summary  Patient ID: Jane [name] MRN: [id number] DOB/AGE: 1975-12-07 35 y.o.  Admit date: 02/16/2011 Discharge date: 02/17/2011  Admission Diagnoses: Urolithiasis  Discharge Diagnoses:  Urolithiasis  Discharged Condition: good  Hospital Course:  Patient admitted for 23-hour observation and pain control following uncomplicated right ureteroscopy and stone extraction.  Patient received pain medications overnight and felt much better the following day. Able to tolerate PO fluids.  Consults: none  Significant Diagnostic Studies: None  Treatments: surgery: Right ureteroscopy, stone extraction.  Discharge Exam: Blood pressure 90/68, pulse 54, temperature 98.2 F (36.8 C), temperature source Oral, resp. rate 12, height 5\' 7"  (1.702 m), weight 57 kg (125 lb 10.6 oz), last menstrual period 11/11/2010, SpO2 97.00%. See progress note from day of discharge.  Disposition: Home or Self Care  Discharge Orders    Future Orders Please Complete By Expires   Discharge patient      Discharge patient        Medication List  As of 02/17/2011  7:12 AM   START taking these medications         oxybutynin 5 MG tablet   Commonly known as: DITROPAN   Take 1 tablet (5 mg total) by mouth every 6 (six) hours as needed (bladder spasms or flank pain).         CONTINUE taking these medications         ibuprofen 200 MG tablet   Commonly known as: ADVIL,MOTRIN      senna-docusate 8.6-50 MG per tablet   Commonly known as: Senokot-S   Take 1 tablet by mouth 2 (two) times daily.          Where to get your medications    These are the prescriptions that you need to pick up.   You may get these medications from any pharmacy.         oxybutynin 5 MG tablet           Follow-up Information    Follow up with Milford Cage, MD in 3 months. (Call [phone] for appointment in 3 months.)    Contact information:   [address] [facility] [facility] [place] [address] [phone]          Signed: Milford Cage 02/17/2011, 7:12 AM

## 2011-02-17 NOTE — Progress Notes (Signed)
Urology Progress Note  Subjective:     No acute urologic events overnight. Pain controlled.  Voiding without difficulty.  Has some reflux.  Objective:  Patient Vitals for the past 24 hrs:  BP Temp Temp src Pulse Resp SpO2 Height Weight  02/17/11 0545 90/68 mmHg - - 54  - - - -  02/17/11 0504 87/57 mmHg 98.2 F (36.8 C) Oral 48  12  97 % - -  02/17/11 0158 94/56 mmHg 98.5 F (36.9 C) Oral 65  14  93 % - -  02/16/11 2250 90/59 mmHg 98.3 F (36.8 C) Oral 52  16  96 % - -  02/16/11 1754 97/62 mmHg 98.4 F (36.9 C) Oral 57  18  95 % - -  02/16/11 1451 98/61 mmHg 98.3 F (36.8 C) Oral 63  18  95 % 5\' 7"  (1.702 m) 57 kg (125 lb 10.6 oz)  02/16/11 1415 106/57 mmHg - - 64  16  96 % - -  02/16/11 1411 90/50 mmHg - - - - 94 % - -  02/16/11 1400 90/48 mmHg - - 65  13  96 % - -  02/16/11 1345 99/70 mmHg - - 73  16  97 % - -  02/16/11 1330 88/54 mmHg - - 159  12  95 % - -  02/16/11 1315 94/58 mmHg - - 64  11  92 % - -  02/16/11 1300 86/57 mmHg - - 82  20  96 % - -  02/16/11 1245 97/60 mmHg - - 60  17  94 % - -  02/16/11 1230 91/55 mmHg - - 57  28  95 % - -  02/16/11 1215 98/61 mmHg - - 87  16  99 % - -  02/16/11 1200 100/55 mmHg - - 88  17  99 % - -  02/16/11 1145 96/52 mmHg - - 78  23  100 % - -  02/16/11 1130 95/53 mmHg - - 72  16  100 % - -  02/16/11 1115 92/53 mmHg - - 66  17  100 % - -  02/16/11 1100 87/48 mmHg - - 57  18  99 % - -  02/16/11 1045 103/73 mmHg - - 61  15  100 % - -  02/16/11 1032 107/66 mmHg - - - 15  100 % - -  02/16/11 1030 107/64 mmHg - - 92  14  98 % - -  02/16/11 1015 95/58 mmHg - - 74  14  100 % - -  02/16/11 1000 92/72 mmHg - - 62  13  100 % - -  02/16/11 0951 - - - - - 100 % - -  02/16/11 0945 120/51 mmHg - - 64  14  100 % - -  02/16/11 0935 110/55 mmHg 97.6 F (36.4 C) - - 16  100 % - -  02/16/11 0807 96/68 mmHg 97.2 F (36.2 C) - 71  18  98 % - -    Physical Exam: General:  No acute distress, awake Cardiovascular:    [x]   S1/S2 present, RRR  []    Irregularly irregular Chest:  CTA-B Abdomen:               []  Soft, appropriately TTP  [x]  Soft, NTTP  []  Soft, appropriately TTP, incision(s) clean/dry/intact  Genitourinary: No catheter     I/O last 3 completed shifts: In: 2318.8 [P.O.:100; I.V.:1868.8; IV Piggyback:350] Out: 950 [Urine:950]  Recent Labs  [place]  02/16/11 0751   HGB 12.3   WBC --   PLT --    Recent Labs  Basename 02/16/11 1511   NA --   K --   CL --   CO2 --   BUN --   CREATININE 0.91   CALCIUM --   GFRNONAA 82*   GFRAA >90     No results found for this basename: PT:2,INR:2,APTT:2 in the last 72 hours   No components found with this basename: ABG:2     Assessment: POD#1 Right uncomplicated ureteroscopy with stone extraction Plan: -Discharge home. Patient has medications from last surgery earlier this month. -Follow up as scheduled in 3 months.   Natalia Leatherwood, MD [phone]

## 2015-02-07 ENCOUNTER — Emergency Department (HOSPITAL_COMMUNITY)
Admission: EM | Admit: 2015-02-07 | Discharge: 2015-02-07 | Disposition: A | Discharge status: Home / Self Care | Payer: Managed Care, Other (non HMO) | Attending: Emergency Medicine | Admitting: Emergency Medicine

## 2015-02-07 ENCOUNTER — Emergency Department (HOSPITAL_COMMUNITY): Payer: Managed Care, Other (non HMO)

## 2015-02-07 ENCOUNTER — Encounter (HOSPITAL_COMMUNITY): Payer: Self-pay | Admitting: Emergency Medicine

## 2015-02-07 DIAGNOSIS — Z9889 Other specified postprocedural states: Secondary | ICD-10-CM | POA: Insufficient documentation

## 2015-02-07 DIAGNOSIS — Z3202 Encounter for pregnancy test, result negative: Secondary | ICD-10-CM | POA: Diagnosis not present

## 2015-02-07 DIAGNOSIS — Z87442 Personal history of urinary calculi: Secondary | ICD-10-CM | POA: Diagnosis not present

## 2015-02-07 DIAGNOSIS — R10A0 Flank pain, unspecified side: Secondary | ICD-10-CM

## 2015-02-07 DIAGNOSIS — R109 Unspecified abdominal pain: Secondary | ICD-10-CM | POA: Insufficient documentation

## 2015-02-07 DIAGNOSIS — R112 Nausea with vomiting, unspecified: Secondary | ICD-10-CM | POA: Insufficient documentation

## 2015-02-07 DIAGNOSIS — M549 Dorsalgia, unspecified: Secondary | ICD-10-CM | POA: Insufficient documentation

## 2015-02-07 DIAGNOSIS — R509 Fever, unspecified: Secondary | ICD-10-CM | POA: Insufficient documentation

## 2015-02-07 DIAGNOSIS — Z9049 Acquired absence of other specified parts of digestive tract: Secondary | ICD-10-CM | POA: Diagnosis not present

## 2015-02-07 LAB — COMPREHENSIVE METABOLIC PANEL
ALT: 25 U/L (ref 14–54)
ANION GAP: 8 (ref 5–15)
AST: 23 U/L (ref 15–41)
Albumin: 3.9 g/dL (ref 3.5–5.0)
Alkaline Phosphatase: 32 U/L — ABNORMAL LOW (ref 38–126)
BILIRUBIN TOTAL: 2.6 mg/dL — AB (ref 0.3–1.2)
BUN: 13 mg/dL (ref 6–20)
CO2: 25 mmol/L (ref 22–32)
Calcium: 8.5 mg/dL — ABNORMAL LOW (ref 8.9–10.3)
Chloride: 102 mmol/L (ref 101–111)
Creatinine, Ser: 0.75 mg/dL (ref 0.44–1.00)
GFR calc Af Amer: >60 mL/min (ref >60)
Glucose, Bld: 102 mg/dL — ABNORMAL HIGH (ref 65–99)
POTASSIUM: 3.3 mmol/L — AB (ref 3.5–5.1)
Sodium: 135 mmol/L (ref 135–145)
TOTAL PROTEIN: 6.1 g/dL — AB (ref 6.5–8.1)

## 2015-02-07 LAB — URINALYSIS, ROUTINE W REFLEX MICROSCOPIC
Glucose, UA: NEGATIVE mg/dL
Hgb urine dipstick: NEGATIVE
Ketones, ur: 15 mg/dL — AB
Nitrite: NEGATIVE
Protein, ur: NEGATIVE mg/dL
Specific Gravity, Urine: 1.03 (ref 1.005–1.030)
pH: 5.5 (ref 5.0–8.0)

## 2015-02-07 LAB — CBC
HEMATOCRIT: 40.8 % (ref 36.0–46.0)
HEMOGLOBIN: 13.1 g/dL (ref 12.0–15.0)
MCH: 30.7 pg (ref 26.0–34.0)
MCHC: 32.1 g/dL (ref 30.0–36.0)
MCV: 95.6 fL (ref 78.0–100.0)
Platelets: 124 10*3/uL — ABNORMAL LOW (ref 150–400)
RBC: 4.27 MIL/uL (ref 3.87–5.11)
RDW: 14.4 % (ref 11.5–15.5)
WBC: 4.6 10*3/uL (ref 4.0–10.5)

## 2015-02-07 LAB — URINE MICROSCOPIC-ADD ON

## 2015-02-07 LAB — I-STAT CG4 LACTIC ACID, ED
LACTIC ACID, VENOUS: 0.74 mmol/L (ref 0.5–2.0)
LACTIC ACID, VENOUS: 0.68 mmol/L (ref 0.5–2.0)

## 2015-02-07 LAB — LIPASE, BLOOD: Lipase: 28 U/L (ref 11–51)

## 2015-02-07 LAB — I-STAT BETA HCG BLOOD, ED (MC, WL, AP ONLY): I-stat hCG, quantitative: <5 m[IU]/mL (ref <5)

## 2015-02-07 MED ORDER — CYCLOBENZAPRINE HCL 10 MG PO TABS: 10.0000 mg | ORAL_TABLET | Freq: Two times a day (BID) | ORAL | Status: DC | PRN

## 2015-02-07 MED ORDER — ONDANSETRON HCL 4 MG PO TABS: 4.0000 mg | ORAL_TABLET | Freq: Four times a day (QID) | ORAL | Status: DC

## 2015-02-07 MED ORDER — KETOROLAC TROMETHAMINE 30 MG/ML IJ SOLN
30.0000 mg | Freq: Once | INTRAMUSCULAR | Status: AC
  Administered 2015-02-07: 30 mg via INTRAVENOUS
  Filled 2015-02-07: qty 1

## 2015-02-07 MED ORDER — SODIUM CHLORIDE 0.9 % IV BOLUS (SEPSIS)
1000.0000 mL | Freq: Once | INTRAVENOUS | Status: AC
  Administered 2015-02-07: 1000 mL via INTRAVENOUS

## 2015-02-07 MED ORDER — NAPROXEN 500 MG PO TABS: 500.0000 mg | ORAL_TABLET | Freq: Two times a day (BID) | ORAL | Status: DC

## 2015-02-07 NOTE — Discharge Instructions (Signed)
Flank Pain Flank pain refers to pain that is located on the side of the body between the upper abdomen and the back. The pain may occur over a short period of time (acute) or may be long-term or reoccurring (chronic). It may be mild or severe. Flank pain can be caused by many things. CAUSES  Some of the more common causes of flank pain include:  Muscle strains.   Muscle spasms.   A disease of your spine (vertebral disk disease).   A lung infection (pneumonia).   Fluid around your lungs (pulmonary edema).   A kidney infection.   Kidney stones.   A very painful skin rash caused by the chickenpox virus (shingles).   Gallbladder disease.  HOME CARE INSTRUCTIONS  Home care will depend on the cause of your pain. In general,  Rest as directed by your caregiver.  Drink enough fluids to keep your urine clear or pale yellow.  Only take over-the-counter or prescription medicines as directed by your caregiver. Some medicines may help relieve the pain.  Tell your caregiver about any changes in your pain.  Follow up with your caregiver as directed. SEEK IMMEDIATE MEDICAL CARE IF:   Your pain is not controlled with medicine.   You have new or worsening symptoms.  Your pain increases.   You have abdominal pain.   You have shortness of breath.   You have persistent nausea or vomiting.   You have swelling in your abdomen.   You feel faint or pass out.   You have blood in your urine.  You have a fever or persistent symptoms for more than 2-3 days.  You have a fever and your symptoms suddenly get worse. MAKE SURE YOU:   Understand these instructions.  Will watch your condition.  Will get help right away if you are not doing well or get worse.   This information is not intended to replace advice given to you by your health care provider. Make sure you discuss any questions you have with your health care provider.   Document Released: 03/31/2005 Document  Revised: 11/02/2011 Document Reviewed: 09/22/2011 Elsevier Interactive Patient Education 2016 [employer]  Pain Without a Known Cause WHAT IS PAIN WITHOUT A KNOWN CAUSE? Pain can occur in any part of the body and can range from mild to severe. Sometimes no cause can be found for why you are having pain. Some types of pain that can occur without a known cause include:   Headache.  Back pain.  Abdominal pain.  Neck pain. HOW IS PAIN WITHOUT A KNOWN CAUSE DIAGNOSED?  Your health care provider will try to find the cause of your pain. This may include:  Physical exam.  Medical history.  Blood tests.  Urine tests.  X-rays. If no cause is found, your health care provider may diagnose you with pain without a known cause.  IS THERE TREATMENT FOR PAIN WITHOUT A CAUSE?  Treatment depends on the kind of pain you have. Your health care provider may prescribe medicines to help relieve your pain.  WHAT CAN I DO AT HOME FOR MY PAIN?   Take medicines only as directed by your health care provider.  Stop any activities that cause pain. During periods of severe pain, bed rest may help.  Try to reduce your stress with activities such as yoga or meditation. Talk to your health care provider for other stress-reducing activity recommendations.  Exercise regularly, if approved by your health care provider.  Eat a healthy diet  that includes fruits and vegetables. This may improve pain. Talk to your health care provider if you have any questions about your diet. WHAT IF MY PAIN DOES NOT GET BETTER?  If you have a painful condition and no reason can be found for the pain or the pain gets worse, it is important to follow up with your health care provider. It may be necessary to repeat tests and look further for a possible cause.    This information is not intended to replace advice given to you by your health care provider. Make sure you discuss any questions you have with your health care  provider.   Follow-up with her primary care provider as soon as possible for reevaluation. Return to the emergency department if you experience severe worsening of your pain, fever, vomiting, blood in your vomit, burning with urination, blood in your stool.

## 2015-02-07 NOTE — ED Notes (Signed)
Pt returned from CT °

## 2015-02-07 NOTE — ED Notes (Signed)
[facility] and N/V x 2 days; pt sent for eval due to abnormal urine results

## 2015-02-07 NOTE — ED Notes (Signed)
Pt to CT

## 2015-02-07 NOTE — ED Provider Notes (Signed)
CSN: [id number]     Arrival date & time 02/07/15  1413 History   First MD Initiated Contact with Patient 02/07/15 1646     Chief Complaint  Patient presents with  . Back Pain  . Emesis     (Consider location/radiation/quality/duration/timing/severity/associated sxs/prior Treatment) HPI   Jane [name] is a 39 y.o M with a pmhx of recurrent nephrolithiasis who presents to the ED c/o flank pain and nausea. Pt states that she's been having intermittent, dull right-sided flank pain over the last several months. Pain is worse in the morning it is also worsened by lying down or sitting still. Patient states that last night she began running a fever of 101 and became very nauseated. This morning she had one episode of nonbloody nonbilious emesis. She was seen at the urgent care clinic and was told that she most likely had a kidney stone and needed to come to the emergency Department immediately. Denies dysuria, hematuria, abdominal pain, diarrhea, melena, hematochezia, vaginal bleeding, vaginal discharge.  Past Medical History  Diagnosis Date  . Right ureteral stone    Past Surgical History  Procedure Laterality Date  . Laparoscopic cholecystectomy  2006  . Tonsillectomy and adenoidectomy  AGE 31  . Dilatation an evacution  03-21-2007    MISSED AB  . Cystoscopy w/ ureteral stent removal  02/16/2011    Procedure: CYSTOSCOPY WITH STENT REMOVAL;  Surgeon: Milford Cage, MD;  Location: [facility];  Service: Urology;  Laterality: Right;  . Cystoscopy with ureteroscopy  02/16/2011    Procedure: CYSTOSCOPY WITH URETEROSCOPY;  Surgeon: Milford Cage, MD;  Location: [facility];  Service: Urology;  Laterality: Right;  . Stone extraction with basket  02/16/2011    Procedure: STONE EXTRACTION WITH BASKET;  Surgeon: Milford Cage, MD;  Location: [facility];  Service: Urology;  Laterality: Right;   History reviewed. No pertinent  family history. Social History  Substance Use Topics  . Smoking status: Never Smoker   . Smokeless tobacco: Never Used  . Alcohol Use: No   OB History    No data available     Review of Systems  All other systems reviewed and are negative.     Allergies  Review of patient's allergies indicates no known allergies.  Home Medications   Prior to Admission medications   Medication Sig Start Date End Date Taking? Authorizing Provider  ibuprofen (ADVIL,MOTRIN) 200 MG tablet Take 200 mg by mouth every 6 (six) hours as needed.      Historical Provider, MD  oxybutynin (DITROPAN) 5 MG tablet Take 1 tablet (5 mg total) by mouth every 6 (six) hours as needed (bladder spasms or flank pain). 02/17/11 02/17/12  Natalia Leatherwood, MD   BP 102/73 mmHg  Pulse 65  Temp(Src) 98.3 F (36.8 C) (Oral)  Resp 18  Ht  (1.702 m)  Wt 59.421 kg  BMI 20.51 kg/m2  SpO2 99% Physical Exam  Constitutional: She is oriented to person, place, and time. She appears well-developed and well-nourished. No distress.  HENT:  Head: Normocephalic and atraumatic.  Mouth/Throat: No oropharyngeal exudate.  Eyes: Conjunctivae and EOM are normal. Pupils are equal, round, and reactive to light. Right eye exhibits no discharge. Left eye exhibits no discharge. No scleral icterus.  Neck: Neck supple.  Cardiovascular: Normal rate, regular rhythm, normal heart sounds and intact distal pulses.  Exam reveals no gallop and no friction rub.   No murmur heard. Pulmonary/Chest: Effort normal and breath  sounds normal. No respiratory distress. She has no wheezes. She has no rales. She exhibits no tenderness.  Abdominal: Soft. She exhibits no distension. There is no tenderness. There is no guarding.  No CVA tenderness.   Musculoskeletal: Normal range of motion. She exhibits no edema.       Back:  Lymphadenopathy:    She has no cervical adenopathy.  Neurological: She is alert and oriented to person, place, and time. No cranial  nerve deficit.  Strength 5/5 throughout. No sensory deficits.    Skin: Skin is warm and dry. No rash noted. She is not diaphoretic. No erythema. No pallor.  Psychiatric: She has a normal mood and affect. Her behavior is normal.  Nursing note and vitals reviewed.   ED Course  Procedures (including critical care time) Labs Review Labs Reviewed  COMPREHENSIVE METABOLIC PANEL - Abnormal; Notable for the following:    Potassium 3.3 (*)    Glucose, Bld 102 (*)    Calcium 8.5 (*)    Total Protein 6.1 (*)    Alkaline Phosphatase 32 (*)    Total Bilirubin 2.6 (*)    All other components within normal limits  CBC - Abnormal; Notable for the following:    Platelets 124 (*)    All other components within normal limits  LIPASE, BLOOD  URINALYSIS, ROUTINE W REFLEX MICROSCOPIC (NOT AT [facility])  I-STAT BETA HCG BLOOD, ED ([facility], [facility], [facility] ONLY)  I-STAT CG4 LACTIC ACID, ED  I-STAT CG4 LACTIC ACID, ED    Imaging Review Ct Renal Stone Study  02/07/2015  CLINICAL DATA:  Mid back pain, nausea and vomiting for 2 days. History of kidney stones. EXAM: CT ABDOMEN AND PELVIS WITHOUT CONTRAST TECHNIQUE: Multidetector CT imaging of the abdomen and pelvis was performed following the standard protocol without IV contrast. COMPARISON:  None. FINDINGS: Lower chest:  No acute findings.  Lung bases are clear. Hepatobiliary: Status post cholecystectomy. No bile duct dilatation. Liver is unremarkable. Probable small benign cyst within the posterior aspects of the right liver lobe, too small to definitively characterize on this noncontrast study. Pancreas: No mass or inflammatory process identified on this un-enhanced exam. Spleen: Within normal limits in size. Adrenals/Urinary Tract: No evidence of urolithiasis or hydronephrosis. No definite mass visualized on this un-enhanced exam. No evidence of perinephric inflammation. Stomach/Bowel: Bowel is normal in caliber. No bowel wall thickening or evidence of bowel wall inflammation  seen. Appendix is normal. Vascular/Lymphatic: No pathologically enlarged lymph nodes. No evidence of abdominal aortic aneurysm. Reproductive: Intrauterine device appears grossly well-positioned. Adnexal regions are unremarkable. Other: No free fluid or abscess collection seen. No free intraperitoneal air. Musculoskeletal: No acute/significant osseous abnormality. Superficial soft tissues are unremarkable. IMPRESSION: No evidence of acute intra-abdominal or intrapelvic abnormality. No renal or ureteral calculi seen. No bowel obstruction. No free fluid. Appendix is normal. Electronically Signed   By: Bary RichardStan  Maynard M.D.   On: 02/07/2015 18:42   I have personally reviewed and evaluated these images and lab results as part of my medical decision-making.   EKG Interpretation None      MDM   Final diagnoses:  Flank pain    Pt with long history of kidney stones presents for back pain, N/V and subjective fevers. No dysuria or hematuria, which pt states she never has when she has kidney stones. Patient was seen at urgent care prior to arrival and was told that she had grossly infected urine and needed a CT scan immediately to rule out kidney stone. Patient appears  well in ED, nontoxic, nonseptic appearing. All vital signs are stable. On exam, no CVA tenderness noted. No leukocytosis. UA reveals no infection. CT scan reveals no evidence of acute abdominal or pelvic abnormality. No renal or ureteral calculi seen. Suspect pain is muscular in etiology. We will discharge home with Flexeril, naproxen and Zofran for nausea. Pregnancy test was negative today. Pain managed in ED. Patient will follow-up with her primary care doctor if symptoms are not improved. Patient was given strict return precautions which are outlined in patient discharge instructions. Discussed treatment plan with patient who is agreeable. Patient is hemodynamically stable and ready for discharge.    Lester Kinsman Slaton, PA-C 02/08/15  0215  Leta Baptist, MD 02/08/15 [phone]

## 2017-04-12 ENCOUNTER — Other Ambulatory Visit: Payer: Self-pay | Admitting: Internal Medicine

## 2017-04-12 DIAGNOSIS — G43119 Migraine with aura, intractable, without status migrainosus: Secondary | ICD-10-CM

## 2017-04-17 ENCOUNTER — Ambulatory Visit
Admission: RE | Admit: 2017-04-17 | Discharge: 2017-04-17 | Disposition: A | Discharge status: Home / Self Care | Payer: 59 | Source: Ambulatory Visit | Attending: Internal Medicine | Admitting: Internal Medicine

## 2017-04-17 DIAGNOSIS — G43119 Migraine with aura, intractable, without status migrainosus: Secondary | ICD-10-CM

## 2017-07-06 ENCOUNTER — Other Ambulatory Visit: Payer: Self-pay | Admitting: Obstetrics and Gynecology

## 2017-07-06 DIAGNOSIS — Z1231 Encounter for screening mammogram for malignant neoplasm of breast: Secondary | ICD-10-CM

## 2017-07-28 ENCOUNTER — Ambulatory Visit: Payer: 59

## 2017-08-10 ENCOUNTER — Ambulatory Visit
Admission: RE | Admit: 2017-08-10 | Discharge: 2017-08-10 | Disposition: A | Discharge status: Home / Self Care | Payer: 59 | Source: Ambulatory Visit | Attending: Obstetrics and Gynecology | Admitting: Obstetrics and Gynecology

## 2017-08-10 DIAGNOSIS — Z1231 Encounter for screening mammogram for malignant neoplasm of breast: Secondary | ICD-10-CM

## 2017-10-30 DIAGNOSIS — Z01419 Encounter for gynecological examination (general) (routine) without abnormal findings: Secondary | ICD-10-CM | POA: Diagnosis not present

## 2017-10-30 DIAGNOSIS — Z6820 Body mass index (BMI) 20.0-20.9, adult: Secondary | ICD-10-CM | POA: Diagnosis not present

## 2017-10-30 DIAGNOSIS — Z1389 Encounter for screening for other disorder: Secondary | ICD-10-CM | POA: Diagnosis not present

## 2017-10-30 DIAGNOSIS — Z0000 Encounter for general adult medical examination without abnormal findings: Secondary | ICD-10-CM | POA: Diagnosis not present

## 2017-10-30 DIAGNOSIS — Z130 Encounter for screening for diseases of the blood and blood-forming organs and certain disorders involving the immune mechanism: Secondary | ICD-10-CM | POA: Diagnosis not present

## 2017-12-28 DIAGNOSIS — M62830 Muscle spasm of back: Secondary | ICD-10-CM | POA: Diagnosis not present

## 2017-12-28 DIAGNOSIS — M9902 Segmental and somatic dysfunction of thoracic region: Secondary | ICD-10-CM | POA: Diagnosis not present

## 2017-12-28 DIAGNOSIS — M542 Cervicalgia: Secondary | ICD-10-CM | POA: Diagnosis not present

## 2017-12-28 DIAGNOSIS — M9901 Segmental and somatic dysfunction of cervical region: Secondary | ICD-10-CM | POA: Diagnosis not present

## 2018-02-07 DIAGNOSIS — M62830 Muscle spasm of back: Secondary | ICD-10-CM | POA: Diagnosis not present

## 2018-02-07 DIAGNOSIS — M9902 Segmental and somatic dysfunction of thoracic region: Secondary | ICD-10-CM | POA: Diagnosis not present

## 2018-02-07 DIAGNOSIS — M542 Cervicalgia: Secondary | ICD-10-CM | POA: Diagnosis not present

## 2018-02-07 DIAGNOSIS — M9901 Segmental and somatic dysfunction of cervical region: Secondary | ICD-10-CM | POA: Diagnosis not present

## 2018-02-24 DIAGNOSIS — J00 Acute nasopharyngitis [common cold]: Secondary | ICD-10-CM | POA: Diagnosis not present

## 2018-03-16 DIAGNOSIS — R490 Dysphonia: Secondary | ICD-10-CM | POA: Diagnosis not present

## 2018-07-11 ENCOUNTER — Other Ambulatory Visit: Payer: Self-pay | Admitting: Obstetrics and Gynecology

## 2018-07-11 DIAGNOSIS — Z1231 Encounter for screening mammogram for malignant neoplasm of breast: Secondary | ICD-10-CM

## 2018-07-12 DIAGNOSIS — H353121 Nonexudative age-related macular degeneration, left eye, early dry stage: Secondary | ICD-10-CM | POA: Diagnosis not present

## 2018-08-14 DIAGNOSIS — M542 Cervicalgia: Secondary | ICD-10-CM | POA: Diagnosis not present

## 2018-08-14 DIAGNOSIS — M9902 Segmental and somatic dysfunction of thoracic region: Secondary | ICD-10-CM | POA: Diagnosis not present

## 2018-08-14 DIAGNOSIS — M62830 Muscle spasm of back: Secondary | ICD-10-CM | POA: Diagnosis not present

## 2018-08-14 DIAGNOSIS — M9901 Segmental and somatic dysfunction of cervical region: Secondary | ICD-10-CM | POA: Diagnosis not present

## 2018-09-10 ENCOUNTER — Ambulatory Visit
Admission: RE | Admit: 2018-09-10 | Discharge: 2018-09-10 | Disposition: A | Discharge status: Home / Self Care | Payer: 59 | Source: Ambulatory Visit | Attending: Obstetrics and Gynecology | Admitting: Obstetrics and Gynecology

## 2018-09-10 ENCOUNTER — Other Ambulatory Visit: Payer: Self-pay

## 2018-09-10 DIAGNOSIS — Z1231 Encounter for screening mammogram for malignant neoplasm of breast: Secondary | ICD-10-CM

## 2018-10-08 DIAGNOSIS — T63303A Toxic effect of unspecified spider venom, assault, initial encounter: Secondary | ICD-10-CM | POA: Diagnosis not present

## 2018-12-04 DIAGNOSIS — Z1151 Encounter for screening for human papillomavirus (HPV): Secondary | ICD-10-CM | POA: Diagnosis not present

## 2018-12-04 DIAGNOSIS — Z124 Encounter for screening for malignant neoplasm of cervix: Secondary | ICD-10-CM | POA: Diagnosis not present

## 2018-12-04 DIAGNOSIS — Z01419 Encounter for gynecological examination (general) (routine) without abnormal findings: Secondary | ICD-10-CM | POA: Diagnosis not present

## 2018-12-04 DIAGNOSIS — Z130 Encounter for screening for diseases of the blood and blood-forming organs and certain disorders involving the immune mechanism: Secondary | ICD-10-CM | POA: Diagnosis not present

## 2018-12-13 DIAGNOSIS — M79644 Pain in right finger(s): Secondary | ICD-10-CM | POA: Diagnosis not present

## 2018-12-13 DIAGNOSIS — G5601 Carpal tunnel syndrome, right upper limb: Secondary | ICD-10-CM | POA: Diagnosis not present

## 2018-12-22 DIAGNOSIS — J019 Acute sinusitis, unspecified: Secondary | ICD-10-CM | POA: Diagnosis not present

## 2018-12-22 DIAGNOSIS — B9689 Other specified bacterial agents as the cause of diseases classified elsewhere: Secondary | ICD-10-CM | POA: Diagnosis not present

## 2018-12-22 DIAGNOSIS — Z6821 Body mass index (BMI) 21.0-21.9, adult: Secondary | ICD-10-CM | POA: Diagnosis not present

## 2019-01-02 DIAGNOSIS — M545 Low back pain: Secondary | ICD-10-CM | POA: Diagnosis not present

## 2019-01-02 DIAGNOSIS — M9903 Segmental and somatic dysfunction of lumbar region: Secondary | ICD-10-CM | POA: Diagnosis not present

## 2019-01-02 DIAGNOSIS — M542 Cervicalgia: Secondary | ICD-10-CM | POA: Diagnosis not present

## 2019-01-02 DIAGNOSIS — M9905 Segmental and somatic dysfunction of pelvic region: Secondary | ICD-10-CM | POA: Diagnosis not present

## 2019-01-25 DIAGNOSIS — Z23 Encounter for immunization: Secondary | ICD-10-CM | POA: Diagnosis not present

## 2019-03-11 DIAGNOSIS — M542 Cervicalgia: Secondary | ICD-10-CM | POA: Diagnosis not present

## 2019-03-11 DIAGNOSIS — M9903 Segmental and somatic dysfunction of lumbar region: Secondary | ICD-10-CM | POA: Diagnosis not present

## 2019-03-11 DIAGNOSIS — M9905 Segmental and somatic dysfunction of pelvic region: Secondary | ICD-10-CM | POA: Diagnosis not present

## 2019-03-11 DIAGNOSIS — M545 Low back pain: Secondary | ICD-10-CM | POA: Diagnosis not present

## 2019-03-26 DIAGNOSIS — M9902 Segmental and somatic dysfunction of thoracic region: Secondary | ICD-10-CM | POA: Diagnosis not present

## 2019-03-26 DIAGNOSIS — M542 Cervicalgia: Secondary | ICD-10-CM | POA: Diagnosis not present

## 2019-03-26 DIAGNOSIS — M62830 Muscle spasm of back: Secondary | ICD-10-CM | POA: Diagnosis not present

## 2019-03-26 DIAGNOSIS — M9901 Segmental and somatic dysfunction of cervical region: Secondary | ICD-10-CM | POA: Diagnosis not present

## 2019-03-29 DIAGNOSIS — H6693 Otitis media, unspecified, bilateral: Secondary | ICD-10-CM | POA: Diagnosis not present

## 2019-03-29 DIAGNOSIS — H6093 Unspecified otitis externa, bilateral: Secondary | ICD-10-CM | POA: Diagnosis not present

## 2019-04-03 DIAGNOSIS — H6993 Unspecified Eustachian tube disorder, bilateral: Secondary | ICD-10-CM | POA: Insufficient documentation

## 2019-04-03 DIAGNOSIS — H6983 Other specified disorders of Eustachian tube, bilateral: Secondary | ICD-10-CM | POA: Insufficient documentation

## 2019-04-22 DIAGNOSIS — M9903 Segmental and somatic dysfunction of lumbar region: Secondary | ICD-10-CM | POA: Diagnosis not present

## 2019-04-22 DIAGNOSIS — M542 Cervicalgia: Secondary | ICD-10-CM | POA: Diagnosis not present

## 2019-04-22 DIAGNOSIS — M9905 Segmental and somatic dysfunction of pelvic region: Secondary | ICD-10-CM | POA: Diagnosis not present

## 2019-04-22 DIAGNOSIS — M545 Low back pain: Secondary | ICD-10-CM | POA: Diagnosis not present

## 2019-04-25 DIAGNOSIS — Z0000 Encounter for general adult medical examination without abnormal findings: Secondary | ICD-10-CM | POA: Diagnosis not present

## 2019-07-15 DIAGNOSIS — M25562 Pain in left knee: Secondary | ICD-10-CM | POA: Diagnosis not present

## 2019-07-15 DIAGNOSIS — S76112A Strain of left quadriceps muscle, fascia and tendon, initial encounter: Secondary | ICD-10-CM | POA: Diagnosis not present

## 2019-07-18 DIAGNOSIS — M9901 Segmental and somatic dysfunction of cervical region: Secondary | ICD-10-CM | POA: Diagnosis not present

## 2019-07-18 DIAGNOSIS — M62830 Muscle spasm of back: Secondary | ICD-10-CM | POA: Diagnosis not present

## 2019-07-18 DIAGNOSIS — M542 Cervicalgia: Secondary | ICD-10-CM | POA: Diagnosis not present

## 2019-07-18 DIAGNOSIS — M9902 Segmental and somatic dysfunction of thoracic region: Secondary | ICD-10-CM | POA: Diagnosis not present

## 2019-09-19 DIAGNOSIS — Y9389 Activity, other specified: Secondary | ICD-10-CM | POA: Diagnosis not present

## 2019-09-19 DIAGNOSIS — S91311A Laceration without foreign body, right foot, initial encounter: Secondary | ICD-10-CM | POA: Diagnosis not present

## 2019-09-19 DIAGNOSIS — S91212A Laceration without foreign body of left great toe with damage to nail, initial encounter: Secondary | ICD-10-CM | POA: Diagnosis not present

## 2019-09-19 DIAGNOSIS — Y92832 Beach as the place of occurrence of the external cause: Secondary | ICD-10-CM | POA: Diagnosis not present

## 2019-09-19 DIAGNOSIS — S99921A Unspecified injury of right foot, initial encounter: Secondary | ICD-10-CM | POA: Diagnosis not present

## 2019-09-19 DIAGNOSIS — W269XXA Contact with unspecified sharp object(s), initial encounter: Secondary | ICD-10-CM | POA: Diagnosis not present

## 2019-09-19 DIAGNOSIS — S91111A Laceration without foreign body of right great toe without damage to nail, initial encounter: Secondary | ICD-10-CM | POA: Diagnosis not present

## 2019-09-30 ENCOUNTER — Other Ambulatory Visit: Payer: Self-pay | Admitting: Obstetrics and Gynecology

## 2019-09-30 DIAGNOSIS — Z1231 Encounter for screening mammogram for malignant neoplasm of breast: Secondary | ICD-10-CM

## 2019-10-04 ENCOUNTER — Encounter: Payer: Self-pay | Admitting: Sports Medicine

## 2019-10-04 ENCOUNTER — Other Ambulatory Visit: Payer: Self-pay | Admitting: Sports Medicine

## 2019-10-04 ENCOUNTER — Other Ambulatory Visit: Payer: Self-pay

## 2019-10-04 ENCOUNTER — Ambulatory Visit: Payer: BC Managed Care – PPO | Admitting: Sports Medicine

## 2019-10-04 DIAGNOSIS — M792 Neuralgia and neuritis, unspecified: Secondary | ICD-10-CM

## 2019-10-04 DIAGNOSIS — M79674 Pain in right toe(s): Secondary | ICD-10-CM

## 2019-10-04 DIAGNOSIS — T148XXA Other injury of unspecified body region, initial encounter: Secondary | ICD-10-CM

## 2019-10-04 DIAGNOSIS — M79671 Pain in right foot: Secondary | ICD-10-CM

## 2019-10-04 NOTE — Progress Notes (Signed)
Subjective: Jane [name] is a 44 y.o. female patient who presents to office for evaluation of right great toe pain.  Patient reports that on July 1 she stepped on something and [place] and had to go to the ER where they put stitches in to help with healing patient reports that there is still throbbing and burning and numbness and swelling to the right great toe has been soaking daily with Epson salt and warm water and applying Polysporin to the healing cut/laceration.  Patient reports that pain is 5 out of 10 with some pain close to the side of the toe that radiates as well as pain close to the nail fold at the right hallux at the medial aspect.  Patient denies redness warmth drainage or any other constitutional symptoms at this time.  Patient Active Problem List   Diagnosis Date Noted  . Strain of left quadriceps tendon 07/15/2019  . Eustachian tube dysfunction, bilateral 04/03/2019  . Ureter, calculus 02/04/2011    No current outpatient medications on file prior to visit.   No current facility-administered medications on file prior to visit.    Allergies  Allergen Reactions  . Codeine Other (See Comments)    Unknown reaction occurred in early childhood Other reaction(s): Other (See Comments) Unknown reaction occurred in early childhood Other reaction(s): Other (See Comments) Unknown reaction occurred in early childhood     Objective:  General: Alert and oriented x3 in no acute distress  Dermatology: Abrasion to right great toe that appears to be healing well with dry scabbing at the plantar aspect, no open lesions bilateral lower extremities, no webspace macerations, no ecchymosis bilateral, all nails x 10 are well manicured, there is minimal tenderness noted to the right hallux medial nail fold no swelling no warmth no redness no drainage no acute signs of ingrowing.  Vascular: Dorsalis Pedis and Posterior Tibial pedal pulses palpable,  Capillary Fill Time 3 seconds,(+) pedal hair growth bilateral, no edema bilateral lower extremities, Temperature gradient within normal limits.  Neurology: Gross sensation intact via light touch bilateral, subjective burning numbness to the right great toe that happened post injury/abrasion.  Musculoskeletal: Mild tenderness with palpation at right great toe with guarding due to pain/sensitivity.  Assessment and Plan: Problem List Items Addressed This Visit    None    Visit Diagnoses    Abrasion    -  Primary   Great toe pain, right       Neuritis       Pain around toenail, right foot           -Complete examination performed -Patient declined a new set of x-rays at this visit at x-rays at the ER and was told there was no foreign object -Discussed treatment options for continued toe pain at area of healing abrasion -Gave patient a sample tube of lidocaine 5% and advised patient to use this around the area for pain which is likely related to her nerves -Advised patient that she could also be having pain around the toenail because she is walking funny due to pain at the plantar lateral side of the toe which could be causing increased pressure to the nail based on her change in gait at this time I do not see any acute concerns for ingrown toenail but did advise patient to continue with soaking with Epson salt and apply Polysporin at the abrasion and at the medial border of the right great toe until  resolved -Advised patient to wear good supportive shoes daily that do not rub or irritate the toe -Patient to return to office as needed or sooner if condition worsens.  Asencion Islam, DPM

## 2019-10-09 ENCOUNTER — Other Ambulatory Visit: Payer: Self-pay

## 2019-10-09 ENCOUNTER — Ambulatory Visit
Admission: RE | Admit: 2019-10-09 | Discharge: 2019-10-09 | Disposition: A | Discharge status: Home / Self Care | Payer: BC Managed Care – PPO | Source: Ambulatory Visit | Attending: Obstetrics and Gynecology | Admitting: Obstetrics and Gynecology

## 2019-10-09 ENCOUNTER — Ambulatory Visit: Payer: BC Managed Care – PPO

## 2019-10-09 DIAGNOSIS — Z1231 Encounter for screening mammogram for malignant neoplasm of breast: Secondary | ICD-10-CM | POA: Diagnosis not present

## 2019-10-16 DIAGNOSIS — Z20822 Contact with and (suspected) exposure to covid-19: Secondary | ICD-10-CM | POA: Diagnosis not present

## 2019-10-24 DIAGNOSIS — M9902 Segmental and somatic dysfunction of thoracic region: Secondary | ICD-10-CM | POA: Diagnosis not present

## 2019-10-24 DIAGNOSIS — M542 Cervicalgia: Secondary | ICD-10-CM | POA: Diagnosis not present

## 2019-10-24 DIAGNOSIS — M9901 Segmental and somatic dysfunction of cervical region: Secondary | ICD-10-CM | POA: Diagnosis not present

## 2019-10-24 DIAGNOSIS — M62830 Muscle spasm of back: Secondary | ICD-10-CM | POA: Diagnosis not present

## 2019-10-30 DIAGNOSIS — H353121 Nonexudative age-related macular degeneration, left eye, early dry stage: Secondary | ICD-10-CM | POA: Diagnosis not present

## 2019-12-04 DIAGNOSIS — U071 COVID-19: Secondary | ICD-10-CM | POA: Diagnosis not present

## 2019-12-05 DIAGNOSIS — B9689 Other specified bacterial agents as the cause of diseases classified elsewhere: Secondary | ICD-10-CM | POA: Diagnosis not present

## 2019-12-05 DIAGNOSIS — J208 Acute bronchitis due to other specified organisms: Secondary | ICD-10-CM | POA: Diagnosis not present

## 2019-12-05 DIAGNOSIS — U071 COVID-19: Secondary | ICD-10-CM | POA: Diagnosis not present

## 2019-12-05 DIAGNOSIS — R6889 Other general symptoms and signs: Secondary | ICD-10-CM | POA: Diagnosis not present

## 2019-12-12 DIAGNOSIS — M9902 Segmental and somatic dysfunction of thoracic region: Secondary | ICD-10-CM | POA: Diagnosis not present

## 2019-12-12 DIAGNOSIS — M9901 Segmental and somatic dysfunction of cervical region: Secondary | ICD-10-CM | POA: Diagnosis not present

## 2019-12-12 DIAGNOSIS — M542 Cervicalgia: Secondary | ICD-10-CM | POA: Diagnosis not present

## 2019-12-12 DIAGNOSIS — M62830 Muscle spasm of back: Secondary | ICD-10-CM | POA: Diagnosis not present

## 2019-12-17 DIAGNOSIS — M79662 Pain in left lower leg: Secondary | ICD-10-CM | POA: Diagnosis not present

## 2019-12-17 DIAGNOSIS — M7989 Other specified soft tissue disorders: Secondary | ICD-10-CM | POA: Diagnosis not present

## 2019-12-26 DIAGNOSIS — Z6820 Body mass index (BMI) 20.0-20.9, adult: Secondary | ICD-10-CM | POA: Diagnosis not present

## 2019-12-26 DIAGNOSIS — Z1389 Encounter for screening for other disorder: Secondary | ICD-10-CM | POA: Diagnosis not present

## 2019-12-26 DIAGNOSIS — Z30432 Encounter for removal of intrauterine contraceptive device: Secondary | ICD-10-CM | POA: Diagnosis not present

## 2019-12-26 DIAGNOSIS — Z130 Encounter for screening for diseases of the blood and blood-forming organs and certain disorders involving the immune mechanism: Secondary | ICD-10-CM | POA: Diagnosis not present

## 2019-12-26 DIAGNOSIS — Z01419 Encounter for gynecological examination (general) (routine) without abnormal findings: Secondary | ICD-10-CM | POA: Diagnosis not present

## 2020-01-02 DIAGNOSIS — M62830 Muscle spasm of back: Secondary | ICD-10-CM | POA: Diagnosis not present

## 2020-01-02 DIAGNOSIS — M542 Cervicalgia: Secondary | ICD-10-CM | POA: Diagnosis not present

## 2020-01-02 DIAGNOSIS — M9901 Segmental and somatic dysfunction of cervical region: Secondary | ICD-10-CM | POA: Diagnosis not present

## 2020-01-02 DIAGNOSIS — M9902 Segmental and somatic dysfunction of thoracic region: Secondary | ICD-10-CM | POA: Diagnosis not present

## 2020-01-23 DIAGNOSIS — M542 Cervicalgia: Secondary | ICD-10-CM | POA: Diagnosis not present

## 2020-01-23 DIAGNOSIS — M62830 Muscle spasm of back: Secondary | ICD-10-CM | POA: Diagnosis not present

## 2020-01-23 DIAGNOSIS — M9902 Segmental and somatic dysfunction of thoracic region: Secondary | ICD-10-CM | POA: Diagnosis not present

## 2020-01-23 DIAGNOSIS — M9901 Segmental and somatic dysfunction of cervical region: Secondary | ICD-10-CM | POA: Diagnosis not present

## 2020-03-05 DIAGNOSIS — D1801 Hemangioma of skin and subcutaneous tissue: Secondary | ICD-10-CM | POA: Diagnosis not present

## 2020-03-05 DIAGNOSIS — D485 Neoplasm of uncertain behavior of skin: Secondary | ICD-10-CM | POA: Diagnosis not present

## 2020-03-05 DIAGNOSIS — D225 Melanocytic nevi of trunk: Secondary | ICD-10-CM | POA: Diagnosis not present

## 2020-05-14 DIAGNOSIS — N6311 Unspecified lump in the right breast, upper outer quadrant: Secondary | ICD-10-CM | POA: Diagnosis not present

## 2020-05-18 ENCOUNTER — Other Ambulatory Visit: Payer: Self-pay | Admitting: Obstetrics and Gynecology

## 2020-05-18 DIAGNOSIS — N6321 Unspecified lump in the left breast, upper outer quadrant: Secondary | ICD-10-CM

## 2020-05-21 ENCOUNTER — Ambulatory Visit
Admission: RE | Admit: 2020-05-21 | Discharge: 2020-05-21 | Disposition: A | Discharge status: Home / Self Care | Payer: BC Managed Care – PPO | Source: Ambulatory Visit | Attending: Obstetrics and Gynecology | Admitting: Obstetrics and Gynecology

## 2020-05-21 ENCOUNTER — Other Ambulatory Visit: Payer: Self-pay

## 2020-05-21 DIAGNOSIS — B9689 Other specified bacterial agents as the cause of diseases classified elsewhere: Secondary | ICD-10-CM | POA: Diagnosis not present

## 2020-05-21 DIAGNOSIS — R922 Inconclusive mammogram: Secondary | ICD-10-CM | POA: Diagnosis not present

## 2020-05-21 DIAGNOSIS — N6321 Unspecified lump in the left breast, upper outer quadrant: Secondary | ICD-10-CM

## 2020-05-21 DIAGNOSIS — J019 Acute sinusitis, unspecified: Secondary | ICD-10-CM | POA: Diagnosis not present

## 2020-05-21 DIAGNOSIS — H6691 Otitis media, unspecified, right ear: Secondary | ICD-10-CM | POA: Diagnosis not present

## 2020-05-21 DIAGNOSIS — N6489 Other specified disorders of breast: Secondary | ICD-10-CM | POA: Diagnosis not present

## 2020-06-05 ENCOUNTER — Other Ambulatory Visit: Payer: BC Managed Care – PPO

## 2020-06-11 ENCOUNTER — Other Ambulatory Visit: Payer: BC Managed Care – PPO

## 2020-07-27 DIAGNOSIS — Z30430 Encounter for insertion of intrauterine contraceptive device: Secondary | ICD-10-CM | POA: Diagnosis not present

## 2020-09-17 DIAGNOSIS — H6690 Otitis media, unspecified, unspecified ear: Secondary | ICD-10-CM | POA: Diagnosis not present

## 2020-09-19 DIAGNOSIS — H00036 Abscess of eyelid left eye, unspecified eyelid: Secondary | ICD-10-CM | POA: Diagnosis not present

## 2020-09-29 DIAGNOSIS — R448 Other symptoms and signs involving general sensations and perceptions: Secondary | ICD-10-CM | POA: Diagnosis not present

## 2020-10-16 DIAGNOSIS — N898 Other specified noninflammatory disorders of vagina: Secondary | ICD-10-CM | POA: Diagnosis not present

## 2020-10-16 DIAGNOSIS — Z30431 Encounter for routine checking of intrauterine contraceptive device: Secondary | ICD-10-CM | POA: Diagnosis not present

## 2020-10-20 DIAGNOSIS — M62830 Muscle spasm of back: Secondary | ICD-10-CM | POA: Diagnosis not present

## 2020-10-20 DIAGNOSIS — M542 Cervicalgia: Secondary | ICD-10-CM | POA: Diagnosis not present

## 2020-10-20 DIAGNOSIS — M9902 Segmental and somatic dysfunction of thoracic region: Secondary | ICD-10-CM | POA: Diagnosis not present

## 2020-10-20 DIAGNOSIS — M9901 Segmental and somatic dysfunction of cervical region: Secondary | ICD-10-CM | POA: Diagnosis not present

## 2020-11-17 DIAGNOSIS — M62830 Muscle spasm of back: Secondary | ICD-10-CM | POA: Diagnosis not present

## 2020-11-17 DIAGNOSIS — M542 Cervicalgia: Secondary | ICD-10-CM | POA: Diagnosis not present

## 2020-11-17 DIAGNOSIS — M9902 Segmental and somatic dysfunction of thoracic region: Secondary | ICD-10-CM | POA: Diagnosis not present

## 2020-11-17 DIAGNOSIS — M9901 Segmental and somatic dysfunction of cervical region: Secondary | ICD-10-CM | POA: Diagnosis not present

## 2020-12-15 DIAGNOSIS — J028 Acute pharyngitis due to other specified organisms: Secondary | ICD-10-CM | POA: Diagnosis not present

## 2020-12-16 DIAGNOSIS — J101 Influenza due to other identified influenza virus with other respiratory manifestations: Secondary | ICD-10-CM | POA: Diagnosis not present

## 2020-12-16 DIAGNOSIS — Z20822 Contact with and (suspected) exposure to covid-19: Secondary | ICD-10-CM | POA: Diagnosis not present

## 2020-12-19 DIAGNOSIS — J019 Acute sinusitis, unspecified: Secondary | ICD-10-CM | POA: Diagnosis not present

## 2020-12-19 DIAGNOSIS — B9689 Other specified bacterial agents as the cause of diseases classified elsewhere: Secondary | ICD-10-CM | POA: Diagnosis not present

## 2021-05-12 DIAGNOSIS — G43109 Migraine with aura, not intractable, without status migrainosus: Secondary | ICD-10-CM | POA: Diagnosis not present

## 2021-12-04 DIAGNOSIS — M5416 Radiculopathy, lumbar region: Secondary | ICD-10-CM | POA: Diagnosis not present

## 2021-12-04 DIAGNOSIS — M545 Low back pain, unspecified: Secondary | ICD-10-CM | POA: Diagnosis not present

## 2021-12-13 IMAGING — MG MM DIGITAL DIAGNOSTIC UNILAT*R* IMPLANT W/ TOMO W/ CAD
8 series · 8 of 20 positions shown · non-contrast
Comparison: Previous exam(s).

CLINICAL DATA: 45-year-old female presenting for evaluation of a
palpable lump in the right breast for 2 weeks.

EXAM:
DIGITAL DIAGNOSTIC UNILATERAL RIGHT MAMMOGRAM WITH IMPLANTS, CAD AND
TOMOSYNTHESIS; ULTRASOUND RIGHT BREAST LIMITED
TECHNIQUE: Right digital diagnostic mammography and breast tomosynthesis was
performed. The images were evaluated with computer-aided detection.
Standard and/or implant displaced views were performed.; Targeted
ultrasound examination of the right breast was performed

[R CC]
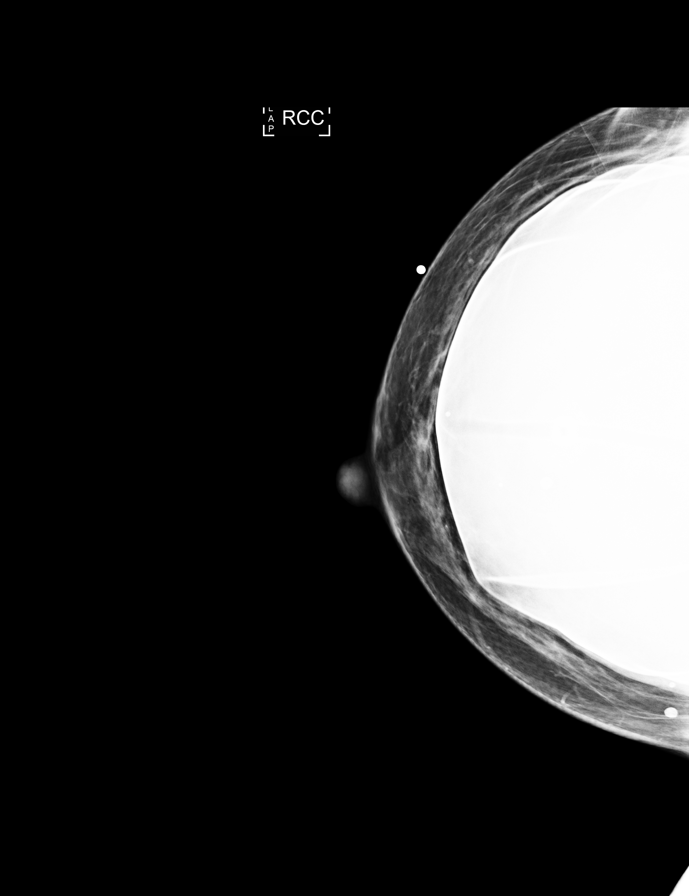

[R MLO]
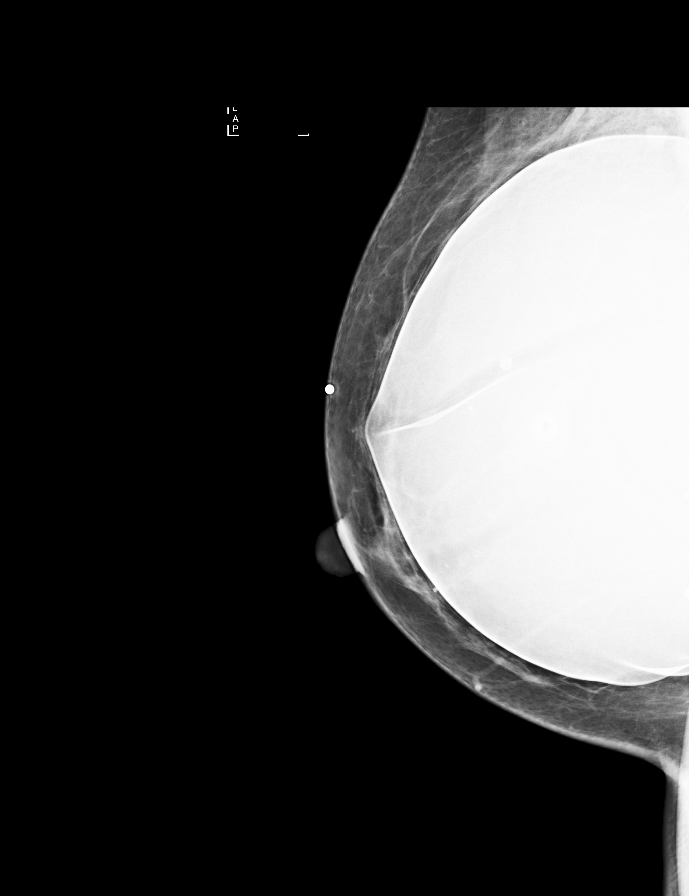

[R CC synth-2D (1 of 2)]
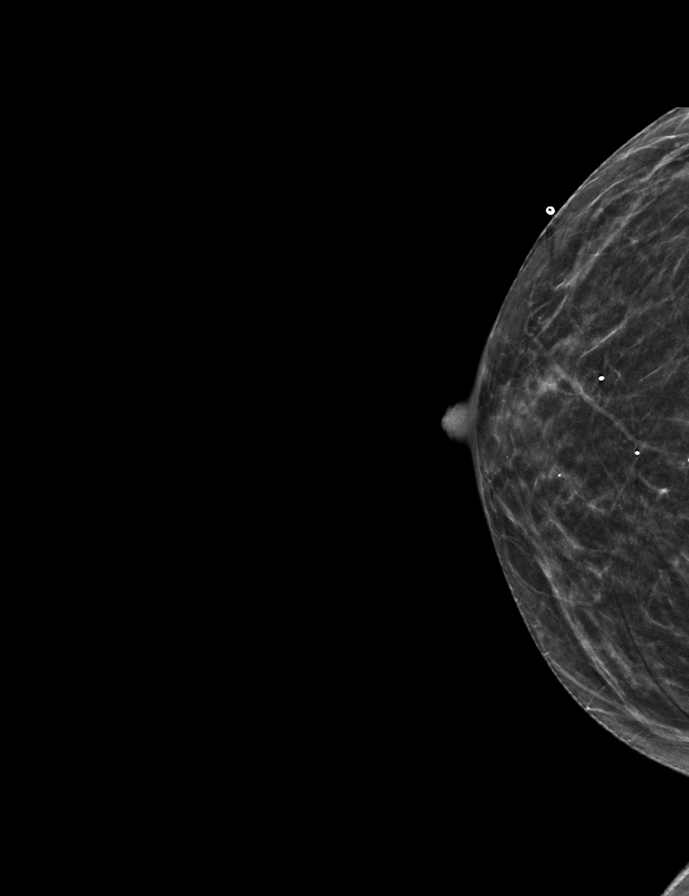

[R CC synth-2D (2 of 2)]
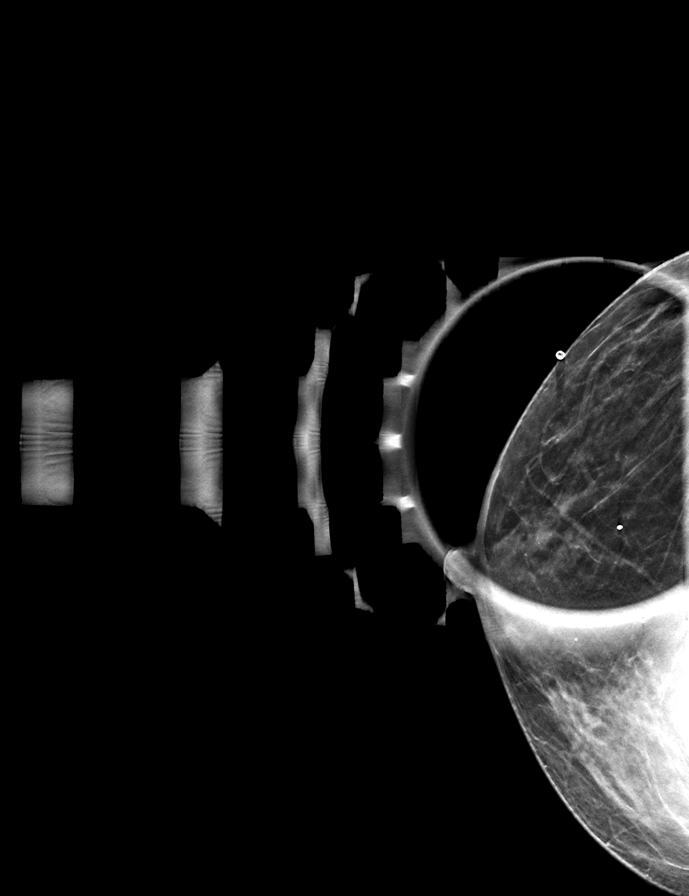

[R MLO synth-2D]
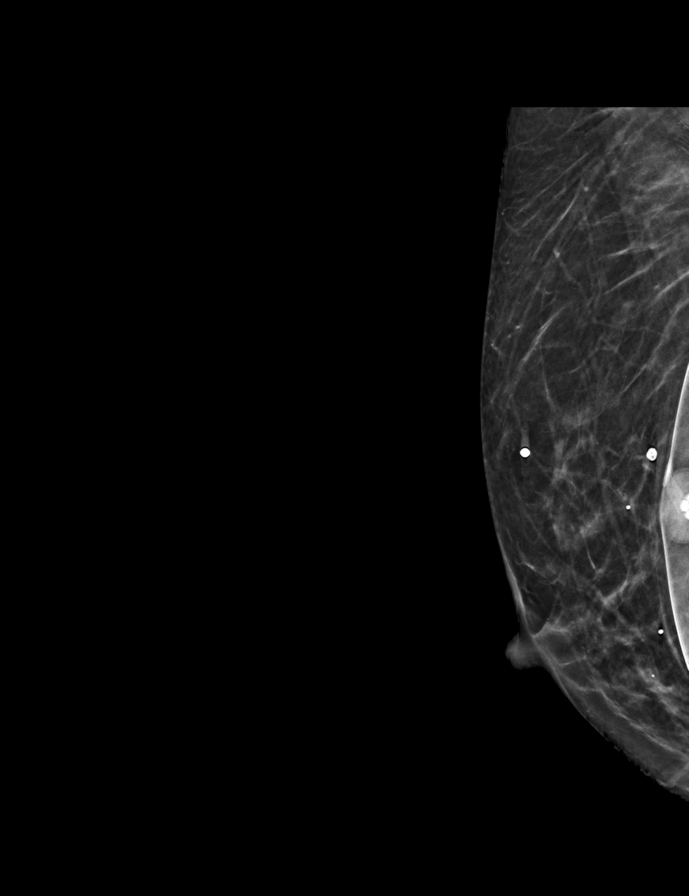

[R CCID BREAST TOMOSYNTHESIS IMAGE tomo (1 of 2) · tomo slice 20/39.0]
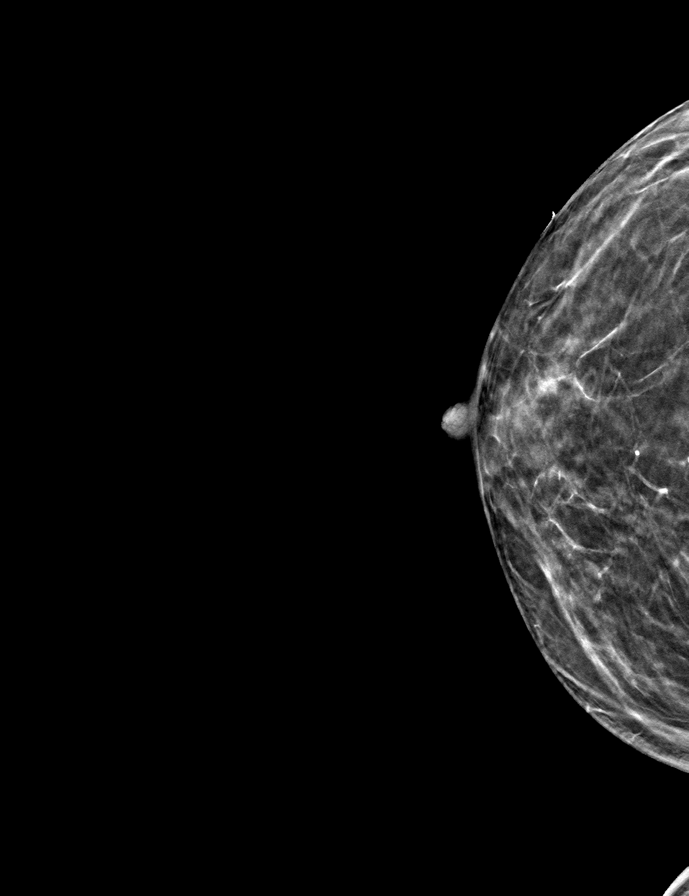

[R CCID BREAST TOMOSYNTHESIS IMAGE tomo (2 of 2) · tomo slice 18/35.0]
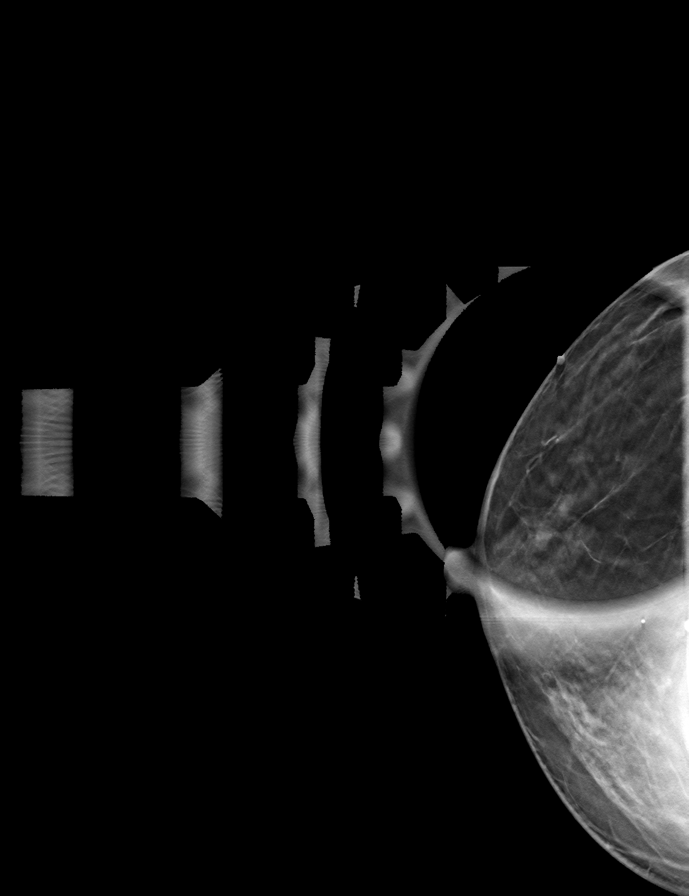

[R MLOID BREAST TOMOSYNTHESIS IMAGE tomo · tomo slice 22/43.0]
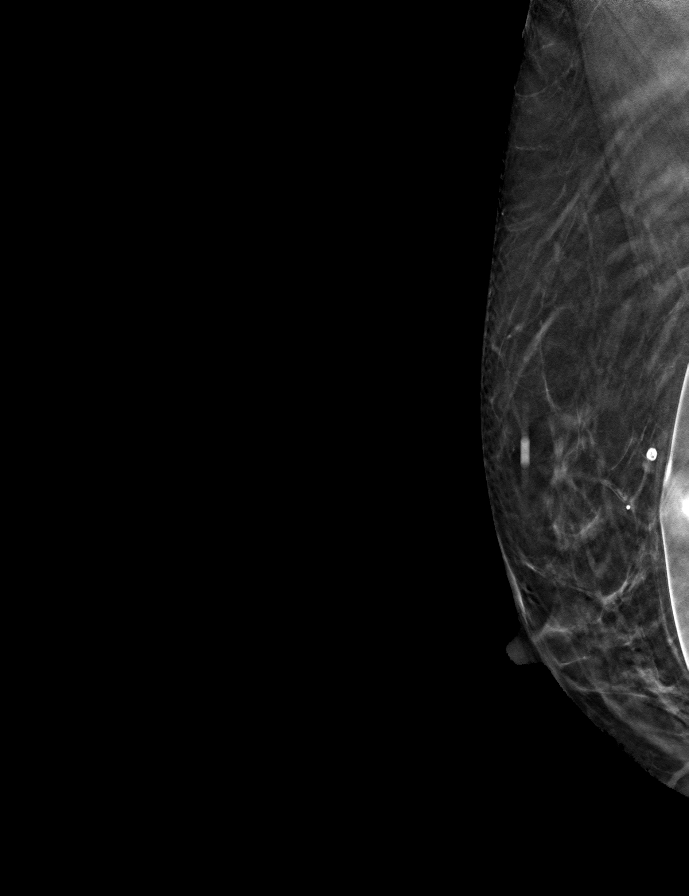

[8 of 20 positions shown; findings below may reference images not displayed]

ACR Breast Density Category c: The breast tissue is heterogeneously
dense, which may obscure small masses.
FINDINGS: No suspicious calcifications, masses or areas of distortion are seen
in the right breast. The patient has retropectoral implants.

Physical exam of the palpable site demonstrates a small firm smooth
palpable lump in the upper-outer quadrant. Ultrasound targeted to
the palpable site at 11 o'clock, 5 cm from the nipple demonstrates
normal fibroglandular tissue. No suspicious masses or areas of
shadowing are identified. The lump does, however, appear to
correspond with the valve of the patient's implant.
IMPRESSION: 1. No suspicious masses in the right breast to correspond with the
patient's palpable area of concern. The palpable likely corresponds
with the patient's implant valve.

RECOMMENDATION:
Return to routine screening mammography is recommended. The patient
will be due for screening in Monday September, 2020.

I have discussed the findings and recommendations with the patient.
If applicable, a reminder letter will be sent to the patient
regarding the next appointment.

BI-RADS CATEGORY  1: Negative.

## 2021-12-13 IMAGING — US US BREAST*R* LIMITED INC AXILLA
1 series · 4 of 4 positions shown · non-contrast
Comparison: Previous exam(s).

CLINICAL DATA: 45-year-old female presenting for evaluation of a
palpable lump in the right breast for 2 weeks.

EXAM:
DIGITAL DIAGNOSTIC UNILATERAL RIGHT MAMMOGRAM WITH IMPLANTS, CAD AND
TOMOSYNTHESIS; ULTRASOUND RIGHT BREAST LIMITED
TECHNIQUE: Right digital diagnostic mammography and breast tomosynthesis was
performed. The images were evaluated with computer-aided detection.
Standard and/or implant displaced views were performed.; Targeted
ultrasound examination of the right breast was performed

[Series 1: us breast*right* limited inc axilla · 0.06mm/px · 4 of 4 slices shown]
[im 1/4]
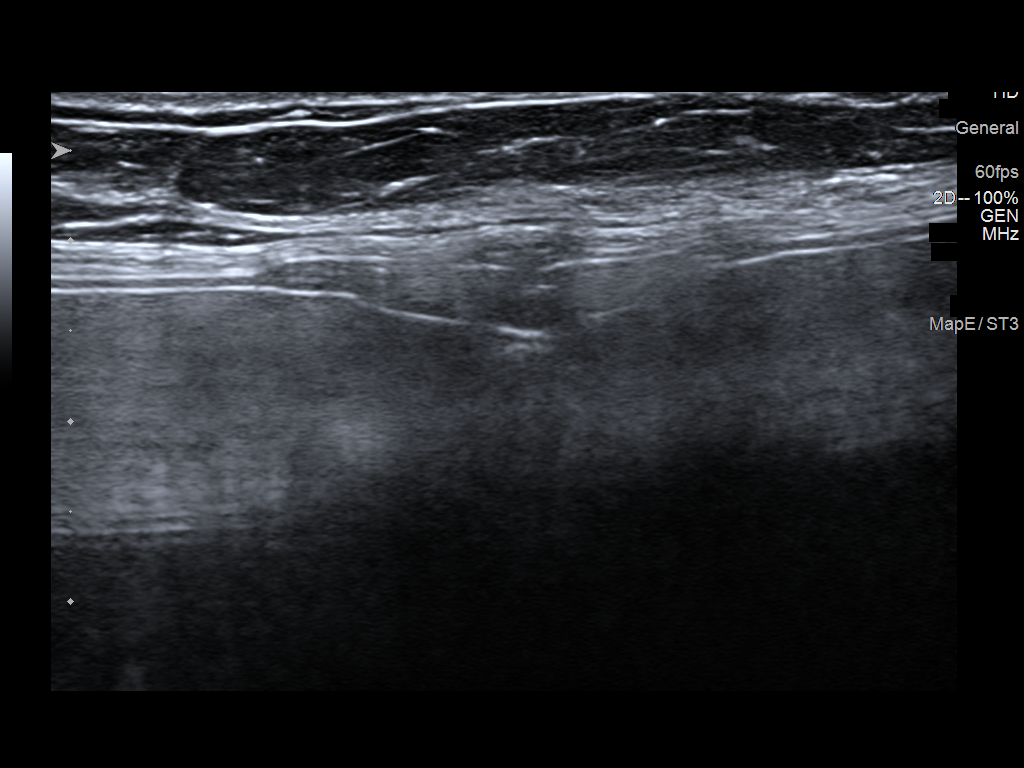
[im 2/4]
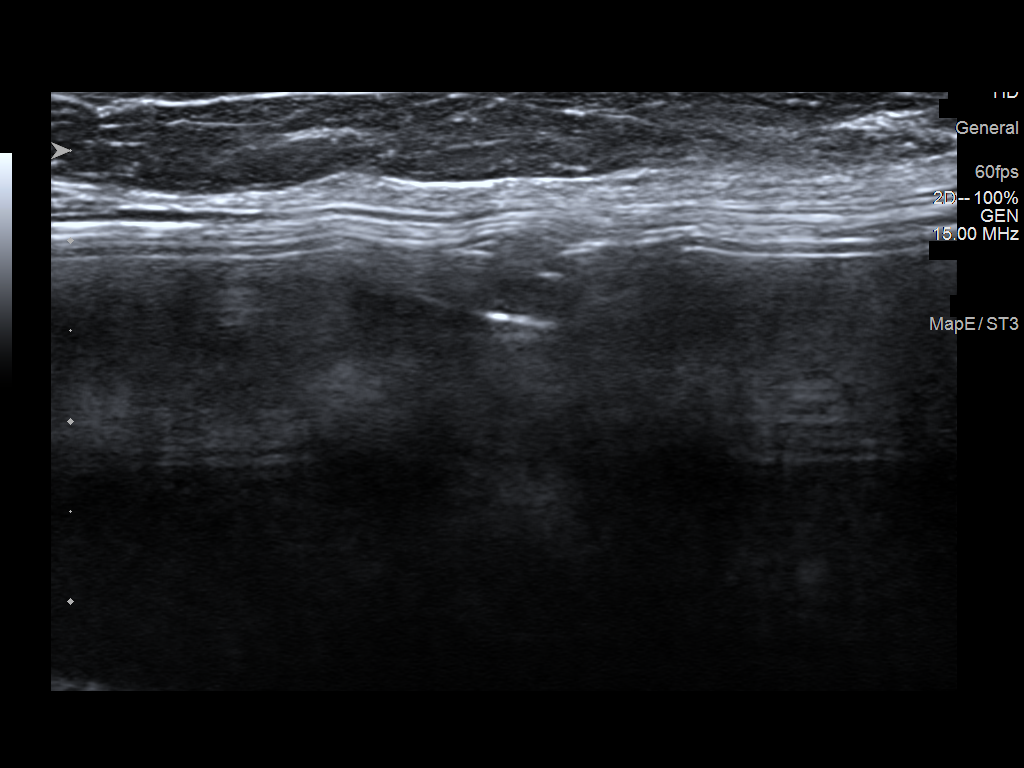
[im 3/4]
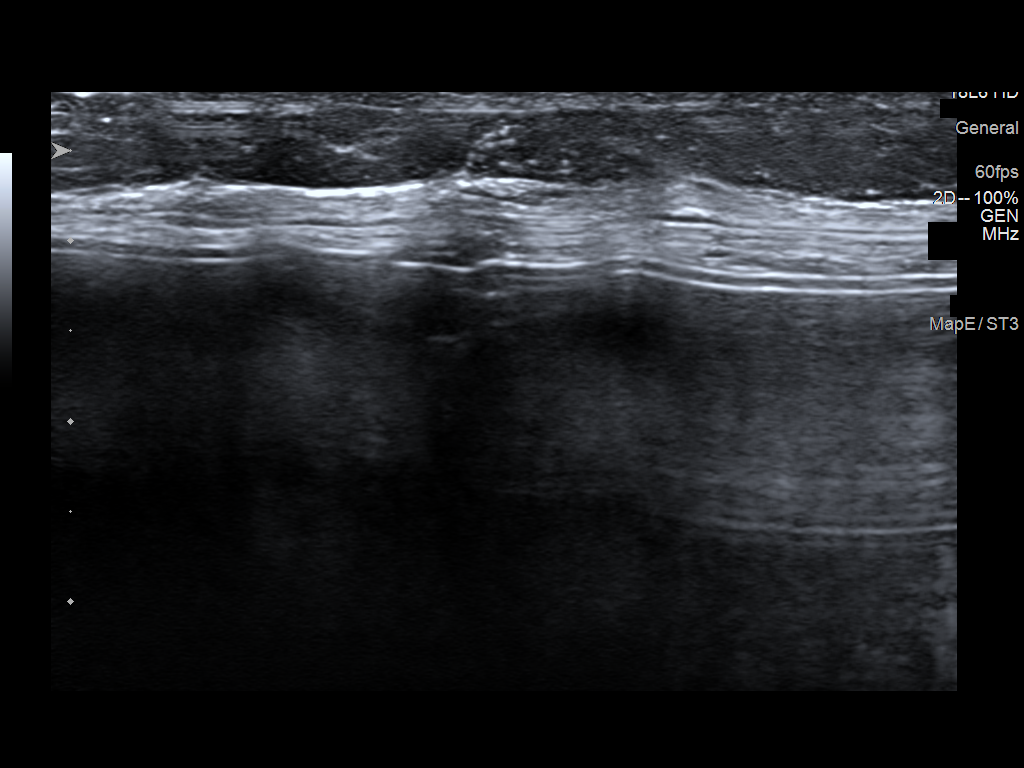
[im 4/4]
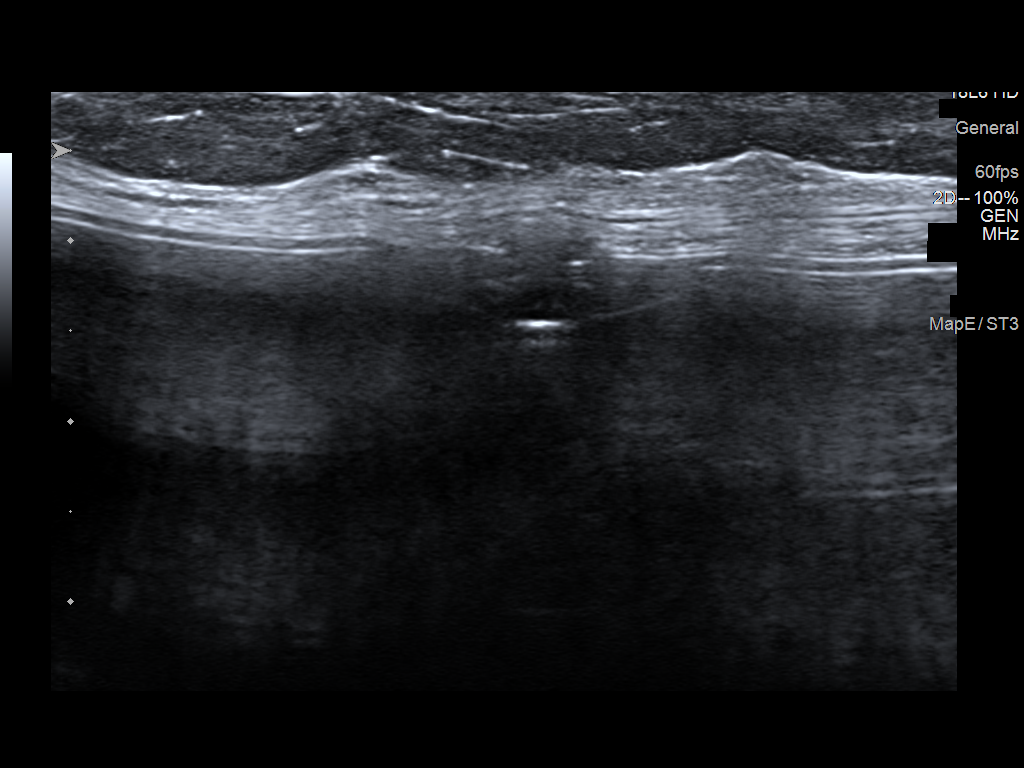

[4 of 4 positions shown; findings below may reference images not displayed]

ACR Breast Density Category c: The breast tissue is heterogeneously
dense, which may obscure small masses.
FINDINGS: No suspicious calcifications, masses or areas of distortion are seen
in the right breast. The patient has retropectoral implants.

Physical exam of the palpable site demonstrates a small firm smooth
palpable lump in the upper-outer quadrant. Ultrasound targeted to
the palpable site at 11 o'clock, 5 cm from the nipple demonstrates
normal fibroglandular tissue. No suspicious masses or areas of
shadowing are identified. The lump does, however, appear to
correspond with the valve of the patient's implant.
IMPRESSION: 1. No suspicious masses in the right breast to correspond with the
patient's palpable area of concern. The palpable likely corresponds
with the patient's implant valve.

RECOMMENDATION:
Return to routine screening mammography is recommended. The patient
will be due for screening in Monday September, 2020.

I have discussed the findings and recommendations with the patient.
If applicable, a reminder letter will be sent to the patient
regarding the next appointment.

BI-RADS CATEGORY  1: Negative.

## 2021-12-14 DIAGNOSIS — M5451 Vertebrogenic low back pain: Secondary | ICD-10-CM | POA: Diagnosis not present

## 2021-12-14 DIAGNOSIS — M5136 Other intervertebral disc degeneration, lumbar region: Secondary | ICD-10-CM | POA: Diagnosis not present

## 2021-12-22 DIAGNOSIS — S81812A Laceration without foreign body, left lower leg, initial encounter: Secondary | ICD-10-CM | POA: Diagnosis not present

## 2022-06-10 ENCOUNTER — Encounter: Payer: Self-pay | Admitting: Obstetrics and Gynecology

## 2022-06-10 DIAGNOSIS — Z0000 Encounter for general adult medical examination without abnormal findings: Secondary | ICD-10-CM

## 2022-06-30 ENCOUNTER — Other Ambulatory Visit: Payer: Self-pay | Admitting: Obstetrics and Gynecology

## 2022-06-30 DIAGNOSIS — Z01419 Encounter for gynecological examination (general) (routine) without abnormal findings: Secondary | ICD-10-CM | POA: Diagnosis not present

## 2022-06-30 DIAGNOSIS — Z1231 Encounter for screening mammogram for malignant neoplasm of breast: Secondary | ICD-10-CM

## 2022-07-12 DIAGNOSIS — J019 Acute sinusitis, unspecified: Secondary | ICD-10-CM | POA: Diagnosis not present

## 2022-07-12 DIAGNOSIS — B9689 Other specified bacterial agents as the cause of diseases classified elsewhere: Secondary | ICD-10-CM | POA: Diagnosis not present

## 2022-08-04 ENCOUNTER — Ambulatory Visit: Payer: BC Managed Care – PPO

## 2022-08-17 ENCOUNTER — Ambulatory Visit
Admission: RE | Admit: 2022-08-17 | Discharge: 2022-08-17 | Disposition: A | Discharge status: Home / Self Care | Payer: BC Managed Care – PPO | Source: Ambulatory Visit | Attending: Obstetrics and Gynecology | Admitting: Obstetrics and Gynecology

## 2022-08-17 DIAGNOSIS — Z1231 Encounter for screening mammogram for malignant neoplasm of breast: Secondary | ICD-10-CM

## 2022-10-20 DIAGNOSIS — Z780 Asymptomatic menopausal state: Secondary | ICD-10-CM | POA: Diagnosis not present

## 2022-10-20 DIAGNOSIS — R5382 Chronic fatigue, unspecified: Secondary | ICD-10-CM | POA: Diagnosis not present

## 2022-10-20 DIAGNOSIS — R5381 Other malaise: Secondary | ICD-10-CM | POA: Diagnosis not present

## 2023-05-02 DIAGNOSIS — R300 Dysuria: Secondary | ICD-10-CM | POA: Diagnosis not present

## 2023-05-02 DIAGNOSIS — B3731 Acute candidiasis of vulva and vagina: Secondary | ICD-10-CM | POA: Diagnosis not present

## 2023-05-02 DIAGNOSIS — N760 Acute vaginitis: Secondary | ICD-10-CM | POA: Diagnosis not present

## 2023-07-25 ENCOUNTER — Other Ambulatory Visit: Payer: Self-pay | Admitting: Obstetrics and Gynecology

## 2023-07-25 DIAGNOSIS — Z0000 Encounter for general adult medical examination without abnormal findings: Secondary | ICD-10-CM

## 2023-08-18 ENCOUNTER — Ambulatory Visit
Admission: RE | Admit: 2023-08-18 | Discharge: 2023-08-18 | Disposition: A | Discharge status: Home / Self Care | Source: Ambulatory Visit | Attending: Obstetrics and Gynecology | Admitting: Obstetrics and Gynecology

## 2023-08-18 DIAGNOSIS — Z0000 Encounter for general adult medical examination without abnormal findings: Secondary | ICD-10-CM

## 2023-08-18 DIAGNOSIS — Z1231 Encounter for screening mammogram for malignant neoplasm of breast: Secondary | ICD-10-CM | POA: Diagnosis not present

## 2023-09-22 DIAGNOSIS — R109 Unspecified abdominal pain: Secondary | ICD-10-CM | POA: Diagnosis not present

## 2023-09-22 DIAGNOSIS — R300 Dysuria: Secondary | ICD-10-CM | POA: Diagnosis not present

## 2023-11-29 DIAGNOSIS — M7502 Adhesive capsulitis of left shoulder: Secondary | ICD-10-CM | POA: Diagnosis not present

## 2024-02-23 ENCOUNTER — Encounter (HOSPITAL_BASED_OUTPATIENT_CLINIC_OR_DEPARTMENT_OTHER): Payer: Self-pay

## 2024-02-23 ENCOUNTER — Other Ambulatory Visit (HOSPITAL_BASED_OUTPATIENT_CLINIC_OR_DEPARTMENT_OTHER): Payer: Self-pay

## 2024-02-23 ENCOUNTER — Ambulatory Visit (HOSPITAL_BASED_OUTPATIENT_CLINIC_OR_DEPARTMENT_OTHER): Admission: EM | Admit: 2024-02-23 | Discharge: 2024-02-23 | Disposition: A | Discharge status: Home / Self Care

## 2024-02-23 DIAGNOSIS — G43909 Migraine, unspecified, not intractable, without status migrainosus: Secondary | ICD-10-CM | POA: Diagnosis not present

## 2024-02-23 DIAGNOSIS — M542 Cervicalgia: Secondary | ICD-10-CM | POA: Diagnosis not present

## 2024-02-23 MED ORDER — TIZANIDINE HCL 4 MG PO TABS
4.0000 mg | ORAL_TABLET | Freq: Four times a day (QID) | ORAL | 0 refills | Status: AC | PRN
  Filled 2024-02-23: qty 30, 8d supply, fill #0

## 2024-02-23 MED ORDER — DEXAMETHASONE SOD PHOSPHATE PF 10 MG/ML IJ SOLN
10.0000 mg | Freq: Once | INTRAMUSCULAR | Status: AC
  Administered 2024-02-23: 10 mg via INTRAMUSCULAR

## 2024-02-23 MED ORDER — KETOROLAC TROMETHAMINE 30 MG/ML IJ SOLN
30.0000 mg | Freq: Once | INTRAMUSCULAR | Status: AC
  Administered 2024-02-23: 30 mg via INTRAMUSCULAR

## 2024-02-23 MED ORDER — ONDANSETRON 4 MG PO TBDP
4.0000 mg | ORAL_TABLET | Freq: Three times a day (TID) | ORAL | 0 refills | Status: AC | PRN
  Filled 2024-02-23: qty 20, 7d supply, fill #0

## 2024-02-23 NOTE — ED Triage Notes (Signed)
 Patient states hx of migraines. Usually managed with otc meds. Woke up at 0300 with headache. Pain to left side of head. States has had imitrex in past but has not had it filled in quite some time. +vomiting as well. Patient has zofran  that she has used at home. Last took tylenol  at 1400

## 2024-02-23 NOTE — ED Notes (Signed)
 Patient states she feels as if she is ready to go home and go to bed. Vitals reassessed and provider notified. Patient's daughter present at bedside to drive patient home.

## 2024-02-23 NOTE — ED Provider Notes (Signed)
 " Jane [name]    CSN: [id number] Arrival date & time: 02/23/24  1511      History   Chief Complaint Chief Complaint  Patient presents with   Headache    HPI Jane [name] is a 49 y.o. female.   Patient is a 49 year old female who presents today with severe migraine.  Patient states hx of migraines. Usually managed with otc meds. Woke up at 0300 with headache. Pain to left side of head. Radiating from her neck. States has had imitrex in past but has not had it filled in quite some time. Vomiting and photophobia. No vision changes. Patient has zofran  that she has used at home. Last took tylenol  at 1400.     Headache   Past Medical History:  Diagnosis Date   Right ureteral stone     Patient Active Problem List   Diagnosis Date Noted   Strain of left quadriceps tendon 07/15/2019   Eustachian tube dysfunction, bilateral 04/03/2019   Ureter, calculus 02/04/2011    Past Surgical History:  Procedure Laterality Date   AUGMENTATION MAMMAPLASTY Bilateral    CYSTOSCOPY W/ URETERAL STENT REMOVAL  02/16/2011   Procedure: CYSTOSCOPY WITH STENT REMOVAL;  Surgeon: Toribio Neysa Repine, MD;  Location: [facility];  Service: Urology;  Laterality: Right;   CYSTOSCOPY WITH URETEROSCOPY  02/16/2011   Procedure: CYSTOSCOPY WITH URETEROSCOPY;  Surgeon: Toribio Neysa Repine, MD;  Location: [facility];  Service: Urology;  Laterality: Right;   DILATATION AN EVACUTION  03-21-2007   MISSED AB   LAPAROSCOPIC CHOLECYSTECTOMY  2006   STONE EXTRACTION WITH BASKET  02/16/2011   Procedure: STONE EXTRACTION WITH BASKET;  Surgeon: Toribio Neysa Repine, MD;  Location: [facility];  Service: Urology;  Laterality: Right;   TONSILLECTOMY AND ADENOIDECTOMY  AGE 84    OB History   No obstetric history on file.      Home Medications    Prior to Admission medications  Medication Sig Start Date End Date Taking? Authorizing Provider  ondansetron   (ZOFRAN -ODT) 4 MG disintegrating tablet Take 1 tablet (4 mg total) by mouth every 8 (eight) hours as needed for nausea or vomiting. 02/23/24  Yes Zyair Russi A, FNP  tiZANidine  (ZANAFLEX ) 4 MG tablet Take 1 tablet (4 mg total) by mouth every 6 (six) hours as needed for muscle spasms. 02/23/24  Yes Adah Wilbert LABOR, FNP    Family History History reviewed. No pertinent family history.  Social History Social History[1]   Allergies   Codeine   Review of Systems Review of Systems  Neurological:  Positive for headaches.     Physical Exam Triage Vital Signs ED Triage Vitals  Encounter Vitals Group     BP 02/23/24 1628 132/84     Girls Systolic BP Percentile --      Girls Diastolic BP Percentile --      Boys Systolic BP Percentile --      Boys Diastolic BP Percentile --      Pulse Rate 02/23/24 1628 73     Resp 02/23/24 1628 20     Temp 02/23/24 1628 98.6 F (37 C)     Temp Source 02/23/24 1628 Oral     SpO2 02/23/24 1628 99 %     Weight --      Height --      Head Circumference --      Peak Flow --      Pain Score 02/23/24 1630 10  Pain Loc --      Pain Education --      Exclude from Growth Chart --    No data found.  Updated Vital Signs BP 123/83 (BP Location: Right Arm)   Pulse 63   Temp 98.6 F (37 C) (Oral)   Resp 20   SpO2 98%   Visual Acuity Right Eye Distance:   Left Eye Distance:   Bilateral Distance:    Right Eye Near:   Left Eye Near:    Bilateral Near:     Physical Exam Constitutional:      General: She is not in acute distress.    Appearance: She is ill-appearing. She is not toxic-appearing or diaphoretic.  HENT:     Right Ear: Tympanic membrane, ear canal and external ear normal.     Left Ear: Tympanic membrane, ear canal and external ear normal.     Mouth/Throat:     Pharynx: Oropharynx is clear.  Eyes:     Extraocular Movements: Extraocular movements intact.     Conjunctiva/sclera: Conjunctivae normal.     Pupils: Pupils are equal,  round, and reactive to light.  Pulmonary:     Effort: Pulmonary effort is normal.  Musculoskeletal:        General: Normal range of motion.  Skin:    General: Skin is warm and dry.  Neurological:     General: No focal deficit present.     Mental Status: She is alert.     Cranial Nerves: No cranial nerve deficit.     Sensory: No sensory deficit.     Motor: No weakness.     Coordination: Coordination normal.     Gait: Gait normal.     Deep Tendon Reflexes: Reflexes normal.  Psychiatric:        Mood and Affect: Mood normal.        Thought Content: Thought content normal.        Judgment: Judgment normal.      UC Treatments / Results  Labs (all labs ordered are listed, but only abnormal results are displayed) Labs Reviewed - No data to display  EKG   Radiology No results found.  Procedures Procedures (including critical [name] time)  Medications Ordered in UC Medications  dexamethasone  (DECADRON ) injection 10 mg (10 mg Intramuscular Given 02/23/24 1724)  ketorolac  (TORADOL ) 30 MG/ML injection 30 mg (30 mg Intramuscular Given 02/23/24 1723)    Initial Impression / Assessment and Plan / UC Course  I have reviewed the triage vital signs and the nursing notes.  Pertinent labs & imaging results that were available during my [name] of the patient were reviewed by me and considered in my medical decision making (see chart for details).     Migraine-patient presents today with chronic migraines.  Reports typically treated with over-the-counter medications but this time she was unable to relieve with over-the-counter meds.  Denies any changes from previous migraines.  These typically start in the neck and radiate up into the head and this is normal for her.  Denies any concerns and there are no red flags on exam.  Migraine cocktail given here in clinic today Zofran  for nausea and vomiting.  Will also prescribe tizanidine  for the neck pain and muscle relaxation.  Recommend go home rest,  drink fluids and follow-up as needed Final Clinical Impressions(s) / UC Diagnoses   Final diagnoses:  Migraine without status migrainosus, not intractable, unspecified migraine type  Neck pain     Discharge Instructions  We treated you here for a migraine with a migraine cocktail.  I am sending and Zofran  for nausea, vomiting and tizanidine  for muscle laxation.  Recommend increase your fluid intake.  Go home and rest and follow-up as needed     ED Prescriptions     Medication Sig Dispense Auth. Provider   tiZANidine  (ZANAFLEX ) 4 MG tablet Take 1 tablet (4 mg total) by mouth every 6 (six) hours as needed for muscle spasms. 30 tablet Shravya Wickwire A, FNP   ondansetron  (ZOFRAN -ODT) 4 MG disintegrating tablet Take 1 tablet (4 mg total) by mouth every 8 (eight) hours as needed for nausea or vomiting. 20 tablet Adah Wilbert LABOR, FNP      PDMP not reviewed this encounter.     [1]  Social History Tobacco Use   Smoking status: Never   Smokeless tobacco: Never  Substance Use Topics   Alcohol use: No   Drug use: No     Adah Wilbert LABOR, FNP 02/24/24 [phone]  "

## 2024-02-23 NOTE — Discharge Instructions (Signed)
 We treated you here for a migraine with a migraine cocktail.  I am sending and Zofran  for nausea, vomiting and tizanidine for muscle laxation.  Recommend increase your fluid intake.  Go home and rest and follow-up as needed
